# Patient Record
Sex: Female | Born: 1960
Health system: Southern US, Community
[De-identification: ages and names within clinical notes are randomized; demographics above are authoritative.]

## PROBLEM LIST (undated history)

## (undated) DIAGNOSIS — M542 Cervicalgia: Secondary | ICD-10-CM

## (undated) HISTORY — DX: Cervicalgia: M54.2

---

## 2018-07-14 ENCOUNTER — Ambulatory Visit: Payer: BLUE CROSS/BLUE SHIELD | Admitting: Family Medicine

## 2018-07-14 ENCOUNTER — Other Ambulatory Visit: Payer: Self-pay

## 2018-07-14 ENCOUNTER — Encounter: Payer: Self-pay | Admitting: Family Medicine

## 2018-07-14 VITALS — BP 112/80 | HR 92 | Temp 97.9°F | Resp 14 | Ht 64.17 in | Wt 132.9 lb

## 2018-07-14 DIAGNOSIS — G4709 Other insomnia: Secondary | ICD-10-CM

## 2018-07-14 DIAGNOSIS — Z1159 Encounter for screening for other viral diseases: Secondary | ICD-10-CM | POA: Diagnosis not present

## 2018-07-14 DIAGNOSIS — R945 Abnormal results of liver function studies: Secondary | ICD-10-CM

## 2018-07-14 DIAGNOSIS — E785 Hyperlipidemia, unspecified: Secondary | ICD-10-CM

## 2018-07-14 DIAGNOSIS — K219 Gastro-esophageal reflux disease without esophagitis: Secondary | ICD-10-CM | POA: Diagnosis not present

## 2018-07-14 DIAGNOSIS — K449 Diaphragmatic hernia without obstruction or gangrene: Secondary | ICD-10-CM

## 2018-07-14 DIAGNOSIS — R7989 Other specified abnormal findings of blood chemistry: Secondary | ICD-10-CM

## 2018-07-14 DIAGNOSIS — Z860100 Personal history of colon polyps, unspecified: Secondary | ICD-10-CM

## 2018-07-14 DIAGNOSIS — M542 Cervicalgia: Secondary | ICD-10-CM

## 2018-07-14 DIAGNOSIS — Z1231 Encounter for screening mammogram for malignant neoplasm of breast: Secondary | ICD-10-CM

## 2018-07-14 DIAGNOSIS — Z114 Encounter for screening for human immunodeficiency virus [HIV]: Secondary | ICD-10-CM

## 2018-07-14 MED ORDER — HYDROXYZINE PAMOATE 25 MG PO CAPS: 25.0000 mg | ORAL_CAPSULE | Freq: Every evening | ORAL | 0 refills | Status: DC | PRN

## 2018-07-14 MED ORDER — OMEPRAZOLE 20 MG PO CPDR: 20.0000 mg | DELAYED_RELEASE_CAPSULE | Freq: Every day | ORAL | 0 refills | Status: DC

## 2018-07-14 NOTE — Patient Instructions (Signed)
Use white noise machine or fan to help block out sounds.  Sleep Hygiene Tips 1) Get regular. One of the best ways to train your body to sleep well is to go to bed and get up at more or less the same time every day, even on weekends and days off! This regular rhythm will make you feel better and will give your body something to work from. 2) Sleep when sleepy. Only try to sleep when you actually feel tired or sleepy, rather than spending too much time awake in bed. 3) Get up & try again. If you haven't been able to get to sleep after about 20 minutes or more, get up and do something calming or boring until you feel sleepy, then return to bed and try again. Sit quietly on the couch with the lights off (bright light will tell your brain that it is time to wake up), or read something boring like the phone book. Avoid doing anything that is too stimulating or interesting, as this will wake you up even more. 4) Avoid caffeine & nicotine. It is best to avoid consuming any caffeine (in coffee, tea, cola drinks, chocolate, and some medications) or nicotine (cigarettes) for at least 4-6 hours before going to bed. These substances act as stimulants and interfere with the ability to fall asleep 5) Avoid alcohol. It is also best to avoid alcohol for at least 4-6 hours before going to bed. Many people believe that alcohol is relaxing and helps them to get to sleep at first, but it actually interrupts the quality of sleep. 6) Bed is for sleeping. Try not to use your bed for anything other than sleeping and sex, so that your body comes to associate bed with sleep. If you use bed as a place to watch TV, eat, read, work on your laptop, pay bills, and other things, your body will not learn this Connection. 7) No naps. It is best to avoid taking naps during the day, to make sure that you are tired at bedtime. If you can't make it through the day without a nap, make sure it is for less than an  hour and before 3pm. 8) Sleep rituals. You can develop your own rituals of things to remind your body that it is time to sleep - some people find it useful to do relaxing stretches or breathing exercises for 15 minutes before bed each night, or sit calmly with a cup of caffeine-free tea. 9) Bathtime. Having a hot bath 1-2 hours before bedtime can be useful, as it will raise your body temperature, causing you to feel sleepy as your body temperature drops again. Research shows that sleepiness is associated with a drop in body temperature. 10) No clock-watching. Many people who struggle with sleep tend to watch the clock too much. Frequently checking the clock during the night can wake you up (especially if you turn on the light to read the time) and reinforces negative thoughts such as "Oh no, look how late it is, I'll never get to sleep" or "it's so early, I have only slept for 5 hours, this is terrible." 11) Use a sleep diary. This worksheet can be a useful way of making sure you have the right facts about your sleep, rather than making assumptions. Because a diary involves watching the clock (see point 10) it is a good idea to only use it for two weeks to get an idea of what is going and then perhaps two months down the  track to see how you are progressing. 12) Exercise. Regular exercise is a good idea to help with good sleep, but try not to do strenuous exercise in the 4 hours before bedtime. Morning walks are a great way to start the day feeling refreshed! 13) Eat right. A healthy, balanced diet will help you to sleep well, but timing is important. Some people find that a very empty stomach at bedtime is distracting, so it can be useful to have a light snack, but a heavy meal soon before bed can also interrupt sleep. Some people recommend a warm glass of milk, which contains tryptophan, which acts as a natural sleep inducer. 14) The right space. It is very important that your  bed and bedroom are quiet and comfortable for sleeping. A cooler room with enough blankets to stay warm is best, and make sure you have curtains or an eyemask to block out early morning light and earplugs if there is noise outside your room. 15) Keep daytime routine the same. Even if you have a bad night sleep and are tired it is important that you try to keep your daytime activities the same as you had planned. That is, don't avoid activities because you feel tired. This can reinforce the insomnia.
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
## 2018-07-17 LAB — LIPID PANEL
Cholesterol: 250 mg/dL — ABNORMAL HIGH (ref <200)
HDL: 80 mg/dL (ref >=50)
LDL Cholesterol (Calc): 151 mg/dL (calc) — ABNORMAL HIGH
Non-HDL Cholesterol (Calc): 170 mg/dL (calc) — ABNORMAL HIGH (ref <130)
Total CHOL/HDL Ratio: 3.1 (calc) (ref <5.0)
Triglycerides: 88 mg/dL (ref <150)

## 2018-07-17 LAB — CBC WITH DIFFERENTIAL/PLATELET
Absolute Monocytes: 431 cells/uL (ref 200–950)
Basophils Absolute: 10 cells/uL (ref 0–200)
Basophils Relative: 0.2 %
Eosinophils Absolute: 108 cells/uL (ref 15–500)
Eosinophils Relative: 2.2 %
HEMATOCRIT: 38.5 % (ref 35.0–45.0)
Hemoglobin: 13.5 g/dL (ref 11.7–15.5)
Lymphs Abs: 2381 cells/uL (ref 850–3900)
MCH: 32.1 pg (ref 27.0–33.0)
MCHC: 35.1 g/dL (ref 32.0–36.0)
MCV: 91.4 fL (ref 80.0–100.0)
MPV: 11.2 fL (ref 7.5–12.5)
Monocytes Relative: 8.8 %
Neutro Abs: 1970 cells/uL (ref 1500–7800)
Neutrophils Relative %: 40.2 %
Platelets: 305 10*3/uL (ref 140–400)
RBC: 4.21 10*6/uL (ref 3.80–5.10)
RDW: 12.1 % (ref 11.0–15.0)
Total Lymphocyte: 48.6 %
WBC: 4.9 10*3/uL (ref 3.8–10.8)

## 2018-07-17 LAB — HEPATITIS C ANTIBODY
Hepatitis C Ab: NONREACTIVE
SIGNAL TO CUT-OFF: 0.01 (ref <1.00)

## 2018-07-17 LAB — COMPLETE METABOLIC PANEL WITH GFR
AG RATIO: 1.5 (calc) (ref 1.0–2.5)
ALT: 29 U/L (ref 6–29)
AST: 56 U/L — ABNORMAL HIGH (ref 10–35)
Albumin: 4.4 g/dL (ref 3.6–5.1)
Alkaline phosphatase (APISO): 72 U/L (ref 37–153)
BUN: 15 mg/dL (ref 7–25)
CO2: 26 mmol/L (ref 20–32)
Calcium: 9.7 mg/dL (ref 8.6–10.4)
Chloride: 104 mmol/L (ref 98–110)
Creat: 0.84 mg/dL (ref 0.50–1.05)
GFR, Est African American: 89 mL/min/{1.73_m2} (ref >=60)
GFR, Est Non African American: 77 mL/min/{1.73_m2} (ref >=60)
Globulin: 3 g/dL (calc) (ref 1.9–3.7)
Glucose, Bld: 83 mg/dL (ref 65–99)
Potassium: 4.3 mmol/L (ref 3.5–5.3)
SODIUM: 140 mmol/L (ref 135–146)
Total Bilirubin: 0.4 mg/dL (ref 0.2–1.2)
Total Protein: 7.4 g/dL (ref 6.1–8.1)

## 2018-07-17 LAB — HIV ANTIBODY (ROUTINE TESTING W REFLEX): HIV 1&2 Ab, 4th Generation: NONREACTIVE

## 2018-08-07 ENCOUNTER — Other Ambulatory Visit: Payer: Self-pay | Admitting: Family Medicine

## 2018-08-07 DIAGNOSIS — G4709 Other insomnia: Secondary | ICD-10-CM

## 2018-10-07 ENCOUNTER — Other Ambulatory Visit: Payer: Self-pay | Admitting: Family Medicine

## 2018-10-07 DIAGNOSIS — K219 Gastro-esophageal reflux disease without esophagitis: Secondary | ICD-10-CM

## 2018-10-07 DIAGNOSIS — K449 Diaphragmatic hernia without obstruction or gangrene: Secondary | ICD-10-CM

## 2018-10-09 ENCOUNTER — Ambulatory Visit: Payer: Self-pay | Admitting: Gastroenterology

## 2018-11-09 ENCOUNTER — Ambulatory Visit: Payer: BLUE CROSS/BLUE SHIELD | Admitting: Gastroenterology

## 2018-11-13 ENCOUNTER — Encounter: Payer: Self-pay | Admitting: Family Medicine

## 2018-11-21 ENCOUNTER — Other Ambulatory Visit: Payer: Self-pay | Admitting: Family Medicine

## 2018-11-21 DIAGNOSIS — Z1231 Encounter for screening mammogram for malignant neoplasm of breast: Secondary | ICD-10-CM

## 2018-12-12 ENCOUNTER — Other Ambulatory Visit: Payer: Self-pay

## 2018-12-12 ENCOUNTER — Ambulatory Visit: Payer: 59 | Admitting: Gastroenterology

## 2018-12-12 VITALS — BP 113/74 | HR 89 | Temp 98.0°F | Ht 64.0 in | Wt 135.8 lb

## 2018-12-12 DIAGNOSIS — K3189 Other diseases of stomach and duodenum: Secondary | ICD-10-CM | POA: Diagnosis not present

## 2018-12-12 DIAGNOSIS — K219 Gastro-esophageal reflux disease without esophagitis: Secondary | ICD-10-CM

## 2018-12-12 DIAGNOSIS — K31A0 Gastric intestinal metaplasia, unspecified: Secondary | ICD-10-CM

## 2018-12-12 DIAGNOSIS — R7989 Other specified abnormal findings of blood chemistry: Secondary | ICD-10-CM

## 2018-12-12 DIAGNOSIS — Z86010 Personal history of colonic polyps: Secondary | ICD-10-CM | POA: Diagnosis not present

## 2018-12-12 DIAGNOSIS — R945 Abnormal results of liver function studies: Secondary | ICD-10-CM

## 2018-12-12 MED ORDER — FAMOTIDINE 40 MG PO TABS: 40.0000 mg | ORAL_TABLET | Freq: Every day | ORAL | 0 refills | Status: DC

## 2018-12-12 NOTE — Progress Notes (Signed)
Jaime MoodKiran Ahnyla Mendel MD, MRCP(U.K) [address]  [address]  [place], [place] [address]  Main: [phone]  Fax: [phone]   Gastroenterology Consultation  Referring Provider:     Doren CustardBoyce, Jaime E, [name] Primary Care Physician:  Jaime CustardBoyce, Jaime E, [name] Primary Gastroenterologist:  Dr. Wyline MoodKiran Jaime Rice  Reason for Consultation:     Acid reflux abnormal LFTs.        HPI:   Jaime Rice is a 58 y.o. y/o female referred for consultation & management  by Dr. Annye AsaBoyce, Jaime ApleyEmily E, [name].   She has been referred for acid reflux, abnormal LFTs, colon cancer screening.  History of colon polyps in the past.  Labs in 07/14/2018 minimally elevated AST of 56.  Hepatitis C antibody negative.  HIV negative. Elevated LDL and total cholesterol.  CBC normal.  She recently moved from [place] a few months back.  She brought some information which was provided to her back at her prior GI doctor in [place].  She has a diagnosis of gastric intestinal metaplasia and recalls having had H. pylori treated in the past.  She denies any abdominal pain at this point of time.  She wanted to know when she would need to have her next upper endoscopy.  It was over 2 years back that she had a last.  She has a history of colon polyps and was told she needed a colonoscopy every year but her last colonoscopy was in 2018 she states which was normal.  She has a history of acid reflux and has been on omeprazole, rabeprazole, omeprazole.  She is concerned of the side effects and wanted know what should she would take on a long-term basis with minimal side effects.  She is also found to have minimally elevated transaminases in the past.  Denies any active alcohol use.  Past Medical History:  Diagnosis Date  . Cervical spine pain     No past surgical history on file.  Prior to Admission medications   Medication Sig Start Date End Date Taking? Authorizing Provider  hydrOXYzine (VISTARIL) 25 MG capsule TAKE 1 CAPSULE (25 MG TOTAL) BY MOUTH AT  BEDTIME AS NEEDED (INSOMNIA). 08/07/18   Jaime CustardBoyce, Jaime E, [name]  omeprazole (PRILOSEC) 20 MG capsule TAKE 1 CAPSULE BY MOUTH EVERY DAY 10/07/18   Jaime CustardBoyce, Jaime E, [name]    No family history on file.   Social History   Tobacco Use  . Smoking status: Never Smoker  . Smokeless tobacco: Never Used  Substance Use Topics  . Alcohol use: Yes    Frequency: Never    Comment: occasional  . Drug use: Never    Allergies as of 12/12/2018  . (No Known Allergies)    Review of Systems:    All systems reviewed and negative except where noted in HPI.   Physical Exam:  BP 113/74   Pulse 89   Temp 98 F (36.7 C)   Ht 5\' 4"  (1.626 m)   Wt 135 lb 12.8 oz (61.6 kg)   BMI 23.31 kg/m  No LMP recorded. Psych:  Alert and cooperative. Normal Rice and affect. General:   Alert,  Well-developed, well-nourished, pleasant and cooperative in NAD Head:  Normocephalic and atraumatic. Eyes:  Sclera clear, no icterus.   Conjunctiva pink. Ears:  Normal auditory acuity. Nose:  No deformity, discharge, or lesions. Mouth:  No deformity or lesions,oropharynx pink & moist. Neck:  Supple; no masses or thyromegaly. Lungs:  Respirations even and unlabored.  Clear throughout to auscultation.  No wheezes, crackles, or rhonchi. No acute distress. Heart:  Regular rate and rhythm; no murmurs, clicks, rubs, or gallops. Abdomen:  Normal bowel sounds.  No bruits.  Soft, non-tender and non-distended without masses, hepatosplenomegaly or hernias noted.  No guarding or rebound tenderness.    Msk:  Symmetrical without gross deformities. Good, equal movement & strength bilaterally. Pulses:  Normal pulses noted. Extremities:  No clubbing or edema.  No cyanosis. Neurologic:  Alert and oriented x3;  grossly normal neurologically. Skin:  Intact without significant lesions or rashes. No jaundice. Lymph Nodes:  No significant cervical adenopathy. Psych:  Alert and cooperative. Normal Rice and affect.  Imaging Studies: No results  found.  Assessment and Plan:   Jaime Rice is a 58 y.o. y/o female has been referred for GERD, abnormal LFT, h/o colon polyps and gastric intestinal metaplasia.  Plan  1. Recheck LFT's if elevated will evaluate further 2. Check for H pylori breath test to confirm negativity 3. GERD- patient information  4. Commence on pepcid 40 mg a day: Discussed long-term use is probably safe with Pepcid.  If she is doing well on 40 mg I will reduce it to 20 mg at her next visit. 5. EGD to evaluate for gastric intestinal metaplasia with gastric mapping and colon polyp surveillance in jan 2021 .   I have discussed alternative options, risks & benefits,  which include, but are not limited to, bleeding, infection, perforation,respiratory complication & drug reaction.  The patient agrees with this plan & written consent will be obtained.     Follow up in 8 weeks   Dr Jonathon Bellows MD,MRCP(U.K)

## 2018-12-13 LAB — HEPATIC FUNCTION PANEL
ALT: 38 IU/L — ABNORMAL HIGH (ref 0–32)
AST: 66 IU/L — ABNORMAL HIGH (ref 0–40)
Albumin: 4.5 g/dL (ref 3.8–4.9)
Alkaline Phosphatase: 80 IU/L (ref 39–117)
Bilirubin Total: 0.2 mg/dL (ref 0.0–1.2)
Bilirubin, Direct: 0.06 mg/dL (ref 0.00–0.40)
Total Protein: 7 g/dL (ref 6.0–8.5)

## 2018-12-13 LAB — H. PYLORI BREATH TEST: H pylori Breath Test: NEGATIVE

## 2018-12-17 ENCOUNTER — Encounter: Payer: Self-pay | Admitting: Gastroenterology

## 2018-12-20 ENCOUNTER — Other Ambulatory Visit: Payer: Self-pay

## 2018-12-20 ENCOUNTER — Telehealth: Payer: Self-pay

## 2018-12-20 DIAGNOSIS — R7989 Other specified abnormal findings of blood chemistry: Secondary | ICD-10-CM

## 2018-12-20 NOTE — Telephone Encounter (Signed)
-----   Message from Jonathon Bellows, MD sent at 12/17/2018 11:35 AM EDT ----- Sherald Hess inform   1. H pylori test is negative 2. LFT's still mildly elevated- suggest blood work to evaluate abnormal LFT's if willing please order my list of tests, RUQ USG  C/c Hubbard Hartshorn, [name]   Dr Jonathon Bellows MD,MRCP [facility]) Gastroenterology/Hepatology Pager: [phone]

## 2018-12-20 NOTE — Telephone Encounter (Signed)
Spoke with pt and informed her of lab results and Dr. Vicente Males suggestions. Pt agrees to have the additional lab tests and we have scheduled the ultrasound appointment.

## 2018-12-21 ENCOUNTER — Ambulatory Visit
Admission: RE | Admit: 2018-12-21 | Discharge: 2018-12-21 | Disposition: A | Discharge status: Home / Self Care | Payer: 59 | Source: Ambulatory Visit | Attending: Family Medicine | Admitting: Family Medicine

## 2018-12-21 DIAGNOSIS — Z1231 Encounter for screening mammogram for malignant neoplasm of breast: Secondary | ICD-10-CM | POA: Insufficient documentation

## 2018-12-25 LAB — ANTI-MICROSOMAL ANTIBODY LIVER / KIDNEY: LKM1 Ab: 0.8 Units (ref 0.0–20.0)

## 2018-12-25 LAB — HEPATITIS B CORE ANTIBODY, TOTAL: Hep B Core Total Ab: NEGATIVE

## 2018-12-25 LAB — HEPATITIS B E ANTIBODY: Hep B E Ab: NEGATIVE

## 2018-12-25 LAB — CERULOPLASMIN: Ceruloplasmin: 24.6 mg/dL (ref 19.0–39.0)

## 2018-12-25 LAB — MITOCHONDRIAL/SMOOTH MUSCLE AB PNL
Mitochondrial Ab: <20 Units (ref 0.0–20.0)
Smooth Muscle Ab: 7 Units (ref 0–19)

## 2018-12-25 LAB — IRON,TIBC AND FERRITIN PANEL
Ferritin: 144 ng/mL (ref 15–150)
Iron Saturation: 32 % (ref 15–55)
Iron: 94 ug/dL (ref 27–159)
Total Iron Binding Capacity: 297 ug/dL (ref 250–450)
UIBC: 203 ug/dL (ref 131–425)

## 2018-12-25 LAB — HIV ANTIBODY (ROUTINE TESTING W REFLEX): HIV Screen 4th Generation wRfx: NONREACTIVE

## 2018-12-25 LAB — CK: Total CK: 98 U/L (ref 32–182)

## 2018-12-25 LAB — IMMUNOGLOBULINS A/E/G/M, SERUM
IgE (Immunoglobulin E), Serum: 147 IU/mL (ref 6–495)
IgG (Immunoglobin G), Serum: 1000 mg/dL (ref 586–1602)
IgM (Immunoglobulin M), Srm: 114 mg/dL (ref 26–217)

## 2018-12-25 LAB — CELIAC DISEASE PANEL
Endomysial IgA: NEGATIVE
IgA/Immunoglobulin A, Serum: 219 mg/dL (ref 87–352)
Transglutaminase IgA: <2 U/mL (ref 0–3)

## 2018-12-25 LAB — HEPATITIS B SURFACE ANTIBODY,QUALITATIVE: Hep B Surface Ab, Qual: REACTIVE

## 2018-12-25 LAB — HEPATITIS B E ANTIGEN: Hep B E Ag: NEGATIVE

## 2018-12-25 LAB — HEPATITIS A ANTIBODY, TOTAL: hep A Total Ab: POSITIVE — AB

## 2018-12-25 LAB — ANA: Anti Nuclear Antibody (ANA): NEGATIVE

## 2018-12-25 LAB — ALPHA-1-ANTITRYPSIN: A-1 Antitrypsin: 115 mg/dL (ref 101–187)

## 2018-12-25 LAB — HEPATITIS B SURFACE ANTIGEN: Hepatitis B Surface Ag: NEGATIVE

## 2018-12-25 LAB — HEPATITIS C ANTIBODY: Hep C Virus Ab: 0.1 s/co ratio (ref 0.0–0.9)

## 2018-12-26 ENCOUNTER — Telehealth: Payer: Self-pay | Admitting: Gastroenterology

## 2018-12-26 NOTE — Telephone Encounter (Signed)
PT is calling for Dr. Georgeann Oppenheim nurse

## 2018-12-26 NOTE — Telephone Encounter (Signed)
-----   Message from Jonathon Bellows, MD sent at 12/25/2018  9:54 AM EDT ----- Sim Boast inform all lab work looks normal.  She is immune to hepatitis A and B probably due to prior vaccination which is expected.  Those results are not abnormal.  C/c Hubbard Hartshorn, [name]   Dr Jonathon Bellows MD,MRCP [facility]) Gastroenterology/Hepatology Pager: [phone]

## 2018-12-26 NOTE — Telephone Encounter (Signed)
Spoke with pt and informed her of lab results. Pt informed me that pre-service contacted her regarding pt ultrasound appt and informed her that her insurance is not in-network with our radiology facility but are in-network with [facility] in [place]. Pt is requesting that we send the U/S order. I explained that I will contact their facility.

## 2018-12-28 ENCOUNTER — Ambulatory Visit: Payer: 59

## 2018-12-28 ENCOUNTER — Encounter: Payer: Self-pay | Admitting: Gastroenterology

## 2019-01-11 ENCOUNTER — Other Ambulatory Visit: Payer: Self-pay | Admitting: Gastroenterology

## 2019-01-11 DIAGNOSIS — R945 Abnormal results of liver function studies: Secondary | ICD-10-CM

## 2019-01-11 DIAGNOSIS — R7989 Other specified abnormal findings of blood chemistry: Secondary | ICD-10-CM

## 2019-01-16 ENCOUNTER — Ambulatory Visit
Admission: RE | Admit: 2019-01-16 | Discharge: 2019-01-16 | Disposition: A | Discharge status: Home / Self Care | Payer: 59 | Source: Ambulatory Visit | Attending: Gastroenterology | Admitting: Gastroenterology

## 2019-01-16 DIAGNOSIS — R7989 Other specified abnormal findings of blood chemistry: Secondary | ICD-10-CM

## 2019-01-16 DIAGNOSIS — R945 Abnormal results of liver function studies: Secondary | ICD-10-CM

## 2019-01-17 ENCOUNTER — Encounter: Payer: Self-pay | Admitting: Gastroenterology

## 2019-01-30 ENCOUNTER — Ambulatory Visit (INDEPENDENT_AMBULATORY_CARE_PROVIDER_SITE_OTHER): Payer: 59 | Admitting: Family Medicine

## 2019-01-30 ENCOUNTER — Encounter: Payer: Self-pay | Admitting: Family Medicine

## 2019-01-30 ENCOUNTER — Other Ambulatory Visit: Payer: Self-pay

## 2019-01-30 ENCOUNTER — Other Ambulatory Visit (HOSPITAL_COMMUNITY)
Admission: RE | Admit: 2019-01-30 | Discharge: 2019-01-30 | Disposition: A | Discharge status: Home / Self Care | Payer: 59 | Source: Ambulatory Visit | Attending: Family Medicine | Admitting: Family Medicine

## 2019-01-30 VITALS — BP 112/72 | HR 83 | Temp 97.3°F | Resp 14 | Ht 64.0 in | Wt 137.0 lb

## 2019-01-30 DIAGNOSIS — Z01419 Encounter for gynecological examination (general) (routine) without abnormal findings: Secondary | ICD-10-CM | POA: Diagnosis not present

## 2019-01-30 DIAGNOSIS — M5020 Other cervical disc displacement, unspecified cervical region: Secondary | ICD-10-CM | POA: Insufficient documentation

## 2019-01-30 DIAGNOSIS — Z124 Encounter for screening for malignant neoplasm of cervix: Secondary | ICD-10-CM

## 2019-01-30 DIAGNOSIS — L2082 Flexural eczema: Secondary | ICD-10-CM | POA: Diagnosis not present

## 2019-01-30 DIAGNOSIS — Z23 Encounter for immunization: Secondary | ICD-10-CM | POA: Diagnosis not present

## 2019-01-30 MED ORDER — TRIAMCINOLONE ACETONIDE 0.1 % EX CREA: 1.0000 "application " | TOPICAL_CREAM | Freq: Two times a day (BID) | CUTANEOUS | 1 refills | Status: DC

## 2019-01-30 NOTE — Patient Instructions (Addendum)
Foam Roller to help with hip pain. Try exercises for about a month, if you're not improving, let us know, and we will refer you to to physical therapy.   ???????????? ????? ??????????? ?????????????? ??????? Hip Rehabilitation in the Home ????? ???????? ?? ????? ????? ?????, ????? ?? ????????? ??????????? ?????? ???????? ????? ?? ?????????? ???????????? ??????????????. ????? ??????????? ????? ?????????, ??????? ????? ?????????? ? ??????????? ??? ???. ??? ??????? ???? ? ????-???????????? ????? ???????? ?????? ? ????, ????? ?????????????? ? ????? ????????????? ???????????? ? ????????????. ?????? ????????????? ???????????? ?????????????? ??????? ???????:  ???????? ??? ????????????? ??????.  ???????? ??????????? ? ???????? (???????? ????????) ?????? ?????????????? ???????.  ????????? ????.  ???????? ?????????????? ? ????????????? ??????????? ???????. ??? ????????????? ????   ??????????? ?????? ???? ??????????, ??????? ??????? ??? ??????? ???? ??? ????-???????????? ? ????????.  ????? ??????????? ?????????? ??????????: ? ?????????? ?????????????? ?????????, ???? ??? ?????????????? ??? ?????? ??? ??????? ????. ?? ?????????? ????????????? ????????, ?? ???????? ? ??? ???????? ?????? ??? ?? ?????????? ??????????. ? ??????? ????????, ????????, ?????? ?????? ???, ??? ??????? ????? ??????? ??????. ??? ???????? ????????? ????? ? ????????????? ??????.  ?? ????? ?????????? ??????????: ? ? ????????? ???? ?????????, ??? ?? ?????????? ????? ? ?????????? ?????????, ????????, ??????? ??? ??????, ?? ??????? ?? ?????? ????????? ??? ?????????? ??????????. ? ?????????? ?????????? ????? ???, ??? ??????? ????? ??????? ?????? ? ???????????????? ?? ??? ???? ? ???????????? ? ????????????. ? ?? ???? ?????? ?????????????, ?????????? ?????????? ??? ??? ???? ?????????? ? ?????????? ? ?????????? ???? ??????????. ? ?????????? ??????????? ????????, ??? ??????? ????? ??????? ??????. ??? ????? ???? ??????????, ? ???????????  ?? ???? ???????? ?? ????????????? ???????, ??????? ?? ?????????. ????????, ??? ??????? ???? ????? ???????????? ???:  ?? ????????? ?????? ???? ?????? ?????.  ?? ?????????? ????.  ?? ?????????????? ? ????????? ???? ??? ????.  ???????? ???????????? ?????? ?????? ???????????? ???? ????????. ?????????? ????? ???, ????? ?????? ???? ?????????? ??????.  ?? ?????? ?????????? ? ???????? (???????????), ???? ?? ??????? ????????????????? ??????, ? ?? ?? ???????? ?? ??? ?????????? ?????? ???????? ?????. ? ???????? ???????? ???????? ????? ????????????: ???? ????????????  He ??????????? ???? ?????????????? ???? ??? ???????? ?? ??? ???? ???? ?? ????????? ?????????? ?????? ???????? ?????. ??????????? ??????? ??? ??????? ??? ??????? ????? ??????? ??????.  ???????? ? ?????? ???????? ?????, ????? ???? ???????? ??????????? ??? ??? ? ?????? ?????????????, ? ????? ????????, ????? ??? ?????????? ????????.  ????????? ???????????????? ??????? ??? ????????. ?????? 1-2 ???? ????????????, ????? ????????. ???? ?? ?????????? ????????, ??? ??? ?????? ???????????? ??????????, ????????? ? ??????. ?????????? ????, ??????????????? ? ?????   ????????????? ??? ? ?????????? ???????? ????? ?????????? ????????, ??? ?? ???? ?????????????. ???????? ???????? ???????? ????????? ???? ? ???? ???????. ? ????????? ??? ? ??????????? ?????, ??????? ??? ??????. ? ?????????? ????????? ????? ????? ????? ? ???????. ? ????????????? ????? ?? ????? ?? 20????? 2-3???? ? ????.  ???? ??? ?????????, ????????????? ????? ? ?????????? ???????? ????? ?????????? ?????????, ??? ?? ???? ?????????????. ????? ???????? ??????? ??????????? ???? ? ????????. ??????????? ???????? ?????, ??????????????? ????? ??????? ??????, ???????? ??????? ???????? ??? ??????. ? ?????????? ????????? ????? ????? ????? ? ?????? ??????????. ? ???????? ?????? ???????? ??? ?????? ?? 20-30?????. ? ???? ???? ?????????? ????-???????, ??????? ?????? ???????? ??? ??????. ??? ????????  ?????, ???? ?? ?? ???????? ??????????? ????, ????? ??? ?????. ? ??? ????? ???? ??????? ???? ????????? ??????.  ?????? ?????????????? ????? ??? ??????? ????? ??????? ??????. ??? ????? ????????????? ??????????? ??????? ? ???????????? ?????????? ????? ???.  ?????????? (?????????????) ????, ???????? ? ????????? ???? ??? ????. ???????????? ???????  ? ????? ???? ?????? ???? ??????? ????????? ? ??????? ?????????, ???????? ?? ???????? ? ?? ?????????. ???? ?????? ???? ??????, ? ?????????????????? ?????????.  ??????? ? ????? ????????, ? ??????? ????? ???????????, ????????, ???????????? ????????? ??????? ? ???????.  ? ??????? ?????????? ???????, ? ? ???????? ? ? ??????? ???????.  ?????? ???????? ????? ?? ???????, ??????? ???????????? ????????? ????. ?????? ????? ? ????????? ???????? ??? ??????? ?????????.  ???????? ?????? ???????? ????? ? ????????????? ?????????, ??????????? ??? ??????? ? ??????????? ????????????? ??????????, ??????? ?? ? ????????? ????? ??????????. ????????? ????????? ????? ????????? ???? ?????? ???????. ????? ????????????  ?????????? ????? ????????????? ? ????? ????????? ? ??? ??? ????? ?????? ? ????? ?????????????. ?????????? ???????? ?????? ????? ????? ?? ???? ????????????.  ?????????? ?????????????? ? ??????????? ????????????? ????????? ?????? ??? ??????? ????? ??????? ??????.  ????????? ?? ??? ??????????? ?????? ??? ??????? ????? ??????? ?????? ??? ??????????????. ??? ?????. ???????, ??????? ??????? ?????? ?????? ???????? ?????  ????? ?????????? ?????????? ????????? ??? ?????  ????????? ????? ??? ????? ?????? ???????????  ??? ? ???? ?????????????? ???? ?? ????? ?????????? ?????????  ????? ??? ???? ???????????? ????????? ??? ????? ?????????? ? ???????? ?????, ????:  ? ??? ????????? ??????? ? ?????????? ??????? ?????????? ??????????.  ? ??? ??????? ???? ? ????????????? ??????? ??? ??????? ???????, ?? ?????????? ???? ????? ?????? ? ?????? ??????????????.  ?? ??????????,  ??? ??? ????????????? ????????????? ?????? (??????) ??????????.  ?? ?? ?????? ?????? ??????????. ?????????? ?????????? ?? ???????, ????:  ?? ?????.  ???? ?????????????????? ??????? ?????????.  ???? ??????? ???? ?????? ?????? ? ?????? ?????? ?? ????. ??????  ???????????? ?????????????? ??????? ????????? ???????? ??? ? ???????? ???????? ? ??????????? ? ???.  ??????????? ?????? ?????????? ????, ???????? ????????? ?????? ???????? ????? ??? ??????????????, ??????? ?? ???????? ? ????????.  ???? ??? ????????? ??????? ? ?????????? ??????? ?????????? ??????????, ?????????? ? ?????? ???????? ?????. ??? ?????????? ?? ????? ???????? ??????, ??????????????? ????? ??????. ??????????? ???????? ????? ???????????? ??? ??????? ? ????? ??????? ??????.  Document Released: 07/09/2016 Document Revised: 07/23/2017 Document Reviewed: 04/14/2017 [employer] Patient Education  2020 [employer].  Hip Exercises Ask your health care provider which exercises are safe for you. Do exercises exactly as told by your health care provider and adjust them as directed. It is normal to feel mild stretching, pulling, tightness, or discomfort as you do these exercises. Stop right away if you feel sudden pain or your pain gets worse. Do not begin these exercises until told by your health care provider. Stretching and range-of-motion exercises These exercises warm up your muscles and joints and improve the movement and flexibility of your hip. These exercises also help to relieve pain, numbness, and tingling. You may be asked to limit your range of motion if you had a hip replacement. Talk to your health care provider about these restrictions. Hamstrings, supine  1. Lie on your back (supine position). 2. Loop a belt or towel over the ball of your left / right foot. The ball of your foot is on the walking surface, right under your toes. 3. Straighten your left / right knee and slowly pull on the belt or towel to raise your leg until  you feel a gentle stretch behind your knee (hamstring). ? Do not let your knee bend while you do this. ? Keep your other leg flat on the floor. 4. Hold this position for __________ seconds. 5. Slowly return your leg to the starting position. Repeat __________ times. Complete this exercise __________ times a day. Hip rotation  1. Lie on your back on a firm surface. 2. With your left / right hand, gently pull your left / right knee toward the shoulder that is on the same side of the body. Stop when your knee is pointing toward the ceiling. 3. Hold your left / right ankle with your other hand. 4. Keeping your knee steady, gently pull your left / right ankle toward your other shoulder until you feel a stretch in your buttocks. ? Keep your hips and shoulders firmly planted while you do this stretch. 5. Hold this position for __________ seconds. Repeat __________ times. Complete this exercise __________ times a day. Seated stretch This exercise is sometimes called hamstrings and adductors stretch. 1. Sit on the floor with your legs stretched wide. Keep your knees straight during this exercise. 2. Keeping your head and back in a straight line, bend at your waist to reach for your left foot (position A). You should feel a stretch in your right inner thigh (adductors). 3. Hold this position for __________ seconds. Then slowly return to the upright position. 4. Keeping your head and back in a straight line, bend at your waist to reach forward (position B). You should feel a stretch behind both of your thighs and knees (hamstrings). 5. Hold this position for __________ seconds. Then slowly return to the upright position. 6. Keeping your head and back in a straight line, bend at your waist to reach for your right foot (position C). You should feel a stretch in your left inner thigh (adductors). 7. Hold this position for __________ seconds. Then slowly return to the upright position. Repeat __________  times. Complete this exercise __________ times a day. Lunge This exercise stretches the muscles of the hip (hip flexors). 1. Place your left / right knee on the floor and bend your other knee so that is directly over your ankle. You should be half-kneeling. 2. Keep good posture with your head over your shoulders. 3. Tighten your buttocks to  point your tailbone downward. This will prevent your back from arching too much. 4. You should feel a gentle stretch in the front of your left / right thigh and hip. If you do not feel a stretch, slide your other foot forward slightly and then slowly lunge forward with your chest up until your knee once again lines up over your ankle. ? Make sure your tailbone continues to point downward. 5. Hold this position for __________ seconds. 6. Slowly return to the starting position. Repeat __________ times. Complete this exercise __________ times a day. Strengthening exercises These exercises build strength and endurance in your hip. Endurance is the ability to use your muscles for a long time, even after they get tired. Bridge This exercise strengthens the muscles of your hip (hip extensors). 1. Lie on your back on a firm surface with your knees bent and your feet flat on the floor. 2. Tighten your buttocks muscles and lift your bottom off the floor until the trunk of your body and your hips are level with your thighs. ? Do not arch your back. ? You should feel the muscles working in your buttocks and the back of your thighs. If you do not feel these muscles, slide your feet 1-2 inches (2.5-5 cm) farther away from your buttocks. 3. Hold this position for __________ seconds. 4. Slowly lower your hips to the starting position. 5. Let your muscles relax completely between repetitions. Repeat __________ times. Complete this exercise __________ times a day. Straight leg raises, side-lying This exercise strengthens the muscles that move the hip joint away from the  center of the body (hip abductors). 1. Lie on your side with your left / right leg in the top position. Lie so your head, shoulder, hip, and knee line up. You may bend your bottom knee slightly to help you balance. 2. Roll your hips slightly forward, so your hips are stacked directly over each other and your left / right knee is facing forward. 3. Leading with your heel, lift your top leg 4-6 inches (10-15 cm). You should feel the muscles in your top hip lifting. ? Do not let your foot drift forward. ? Do not let your knee roll toward the ceiling. 4. Hold this position for __________ seconds. 5. Slowly return to the starting position. 6. Let your muscles relax completely between repetitions. Repeat __________ times. Complete this exercise __________ times a day. Straight leg raises, side-lying This exercise strengthen the muscles that move the hip joint toward the center of the body (hip adductors). 1. Lie on your side with your left / right leg in the bottom position. Lie so your head, shoulder, hip, and knee line up. You may place your upper foot in front to help you balance. 2. Roll your hips slightly forward, so your hips are stacked directly over each other and your left / right knee is facing forward. 3. Tense the muscles in your inner thigh and lift your bottom leg 4-6 inches (10-15 cm). 4. Hold this position for __________ seconds. 5. Slowly return to the starting position. 6. Let your muscles relax completely between repetitions. Repeat __________ times. Complete this exercise __________ times a day. Straight leg raises, supine This exercise strengthens the muscles in the front of your thigh (quadriceps). 1. Lie on your back (supine position) with your left / right leg extended and your other knee bent. 2. Tense the muscles in the front of your left / right thigh. You should see your kneecap slide up  or see increased dimpling just above your knee. 3. Keep these muscles tight as you  raise your leg 4-6 inches (10-15 cm) off the floor. Do not let your knee bend. 4. Hold this position for __________ seconds. 5. Keep these muscles tense as you lower your leg. 6. Relax the muscles slowly and completely between repetitions. Repeat __________ times. Complete this exercise __________ times a day. Hip abductors, standing This exercise strengthens the muscles that move the leg and hip joint away from the center of the body (hip abductors). 1. Tie one end of a rubber exercise band or tubing to a secure surface, such as a chair, table, or pole. 2. Loop the other end of the band or tubing around your left / right ankle. 3. Keeping your ankle with the band or tubing directly opposite the secured end, step away until there is tension in the tubing or band. Hold on to a chair, table, or pole as needed for balance. 4. Lift your left / right leg out to your side. While you do this: ? Keep your back upright. ? Keep your shoulders over your hips. ? Keep your toes pointing forward. ? Make sure to use your hip muscles to slowly lift your leg. Do not tip your body or forcefully lift your leg. 5. Hold this position for __________ seconds. 6. Slowly return to the starting position. Repeat __________ times. Complete this exercise __________ times a day. Squats This exercise strengthens the muscles in the front of your thigh (quadriceps). 1. Stand in a door frame so your feet and knees are in line with the frame. You may place your hands on the frame for balance. 2. Slowly bend your knees and lower your hips like you are going to sit in a chair. ? Keep your lower legs in a straight-up-and-down position. ? Do not let your hips go lower than your knees. ? Do not bend your knees lower than told by your health care provider. ? If your hip pain increases, do not bend as low. 3. Hold this position for ___________ seconds. 4. Slowly push with your legs to return to standing. Do not use your hands to  pull yourself to standing. Repeat __________ times. Complete this exercise __________ times a day. This information is not intended to replace advice given to you by your health care provider. Make sure you discuss any questions you have with your health care provider. Document Released: 04/30/2005 Document Revised: 02/21/2018 Document Reviewed: 02/21/2018 Elsevier Patient Education  2020 [employer].

## 2019-01-30 NOTE — Progress Notes (Signed)
Name: Jaime Rice   MRN: [id number]    DOB: [birth year]   Date:01/30/2019       Progress Note  Subjective  Chief Complaint  No chief complaint on file.   HPI  Patient presents for annual CPE.  Diet: Balanced Exercise: 20-65mn daily cardio, sometimes swimming in the summer  USPSTF grade A and B recommendations    [facility]-C Score  1    2 times a week - wine or cocktail - 1 drink per sitting  Depression: Phq 9 is  negative Depression screen [place]/9 07/14/2018  Decreased Interest 0  Down, Depressed, Hopeless 0  PHQ - 2 Score 0  Altered sleeping 0  Tired, decreased energy 0  Change in appetite 0  Feeling bad or failure about yourself  0  Trouble concentrating 0  Moving slowly or fidgety/restless 0  Suicidal thoughts 0  PHQ-9 Score 0  Difficult doing work/chores Not difficult at all   Hypertension: BP Readings from Last 3 Encounters:  12/12/18 113/74  07/14/18 112/80   Obesity: Wt Readings from Last 3 Encounters:  12/12/18 135 lb 12.8 oz (61.6 kg)  07/14/18 132 lb 14.4 oz (60.3 kg)   BMI Readings from Last 3 Encounters:  12/12/18 23.31 kg/m  07/14/18 22.69 kg/m     Hep C Screening: Negative August 2020 STD testing and prevention (HIV/chl/gon/syphilis): She had negative HIV in August 2020; ok with gonorrhea/chlam today in her Pap Intimate partner violence: No concerns Sexual History/Pain during Intercourse: Denies; no bleeding after intercourse.  Menstrual History/LMP/Abnormal Bleeding: Postmenopausal x10 years.  No Vaginal bleeding since then. Incontinence Symptoms: No concerns.   Breast cancer:  - Last Mammogram: 12/21/2018 - BRCA gene screening: No family history  Osteoporosis Screening: No family history  Cervical cancer screening: We will check today  Skin cancer: No concerning moles or areas of concern for skin cancer; She does have eczema flare to the bilateral flexural aspects of the  medial wrists - will provide triamcinolone cream today. Colorectal cancer: - Seeing Dr. AVicente Males - will need to have done in 1 year. Lung cancer:  Never smoker. Low Dose CT Chest recommended if Age 58-80years, 30 pack-year currently smoking OR have quit w/in 15years. Patient does not qualify.   ECG: Denies chest pain, shortness of breath, or palpitations.  Advanced Care Planning: A voluntary discussion about advance care planning including the explanation and discussion of advance directives.  Discussed health care proxy and Living will, and the patient was able to identify a health care proxy as Husband.  Patient does not have a living will at present time. If patient does have living will, I have requested they bring this to the clinic to be scanned in to their chart.  Lipids: Lab Results  Component Value Date   CHOL 250 (H) 07/14/2018   Lab Results  Component Value Date   HDL 80 07/14/2018   Lab Results  Component Value Date   LDLCALC 151 (H) 07/14/2018   Lab Results  Component Value Date   TRIG 88 07/14/2018   Lab Results  Component Value Date   CHOLHDL 3.1 07/14/2018   No results found for: LDLDIRECT  Glucose: Glucose, Bld  Date Value Ref Range Status  07/14/2018 83 65 - 99 mg/dL Final    Comment:    .            Fasting reference interval .     Patient Active Problem  List   Diagnosis Date Noted  . Hiatal hernia 07/14/2018  . Gastroesophageal reflux disease without esophagitis 07/14/2018  . History of colon polyps 07/14/2018  . Cervicalgia 07/14/2018  . Other insomnia 07/14/2018  . Elevated LFTs 07/14/2018  . Hyperlipidemia 07/14/2018    No past surgical history on file.  No family history on file.  Social History   Socioeconomic History  . Marital status: Married    Spouse name: Duanne Moron  . Number of children: 1  . Years of education: Not on file  . Highest education level: Not on file  Occupational History  . Not on file  Social Needs  .  Financial resource strain: Not hard at all  . Food insecurity    Worry: Never true    Inability: Never true  . Transportation needs    Medical: No    Non-medical: No  Tobacco Use  . Smoking status: Never Smoker  . Smokeless tobacco: Never Used  Substance and Sexual Activity  . Alcohol use: Yes    Frequency: Never    Comment: occasional  . Drug use: Never  . Sexual activity: Not on file  Lifestyle  . Physical activity    Days per week: 5 days    Minutes per session: 30 min  . Stress: Not at all  Relationships  . Social connections    Talks on phone: More than three times a week    Gets together: More than three times a week    Attends religious service: 1 to 4 times per year    Active member of club or organization: No    Attends meetings of clubs or organizations: Never    Relationship status: Married  . Intimate partner violence    Fear of current or ex partner: No    Emotionally abused: No    Physically abused: No    Forced sexual activity: No  Other Topics Concern  . Not on file  Social History Narrative   Moved from [place] (Originally from [place] - moved to [place] after college), moved to [place] in October 2019 to live with her husband.  Has 1 daughter who lives in [place]. She has 1 dog.  She is still searching for a job.     Current Outpatient Medications:  .  famotidine (PEPCID) 40 MG tablet, Take 1 tablet (40 mg total) by mouth at bedtime., Disp: 90 tablet, Rfl: 0 .  hydrOXYzine (VISTARIL) 25 MG capsule, TAKE 1 CAPSULE (25 MG TOTAL) BY MOUTH AT BEDTIME AS NEEDED (INSOMNIA)., Disp: 90 capsule, Rfl: 1 .  omeprazole (PRILOSEC) 20 MG capsule, TAKE 1 CAPSULE BY MOUTH EVERY DAY, Disp: 90 capsule, Rfl: 0  No Known Allergies   ROS  Constitutional: Negative for fever or weight change.  Respiratory: Negative for cough and shortness of breath.   Cardiovascular: Negative for chest pain or palpitations.  Gastrointestinal: Negative for abdominal pain, no bowel changes.   Musculoskeletal: Negative for gait problem or joint swelling.  She notes ongoing LEFT hip pain that has been recurrent, however she is having a flare over the last few months.  Not taking any medication for this issue.  We will try home exercise first. Skin: Negative for rash.  Neurological: Negative for dizziness or headache.  No other specific complaints in a complete review of systems (except as listed in HPI above).   Objective  There were no vitals filed for this visit.  There is no height or weight on file to calculate BMI.  Physical Exam  Constitutional: Patient appears well-developed and well-nourished. No distress.  HENT: Head: Normocephalic and atraumatic. Ears: B TMs ok, no erythema or effusion; Nose: Nose normal. Mouth/Throat: Oropharynx is clear and moist. No oropharyngeal exudate.  Eyes: Conjunctivae and EOM are normal. Pupils are equal, round, and reactive to light. No scleral icterus.  Neck: Normal range of motion. Neck supple. No JVD present. No thyromegaly present.  Cardiovascular: Normal rate, regular rhythm and normal heart sounds.  No murmur heard. No BLE edema. Pulmonary/Chest: Effort normal and breath sounds normal. No respiratory distress. Abdominal: Soft. Bowel sounds are normal, no distension. There is no tenderness. no masses Breast: no lumps or masses, no nipple discharge or rashes FEMALE GENITALIA:  External genitalia normal External urethra normal Vaginal vault normal without discharge or lesions Cervix normal without discharge or lesions Bimanual exam normal without masses RECTAL: no rectal masses or hemorrhoids Musculoskeletal: Normal range of motion, no joint effusions. No gross deformities Neurological: he is alert and oriented to person, place, and time. No cranial nerve deficit. Coordination, balance, strength, speech and gait are normal.  Skin: Skin is warm and dry. No erythema. There is dry erythematous scaling skin to the bilateral flexural  aspects of the wrists. Psychiatric: Patient has a normal mood and affect. behavior is normal. Judgment and thought content normal.  Recent Results (from the past 2160 hour(s))  Hepatic function panel     Status: Abnormal   Collection Time: 12/12/18  2:24 PM  Result Value Ref Range   Total Protein 7.0 6.0 - 8.5 g/dL   Albumin 4.5 3.8 - 4.9 g/dL   Bilirubin Total 0.2 0.0 - 1.2 mg/dL   Bilirubin, Direct 0.06 0.00 - 0.40 mg/dL   Alkaline Phosphatase 80 39 - 117 IU/L   AST 66 (H) 0 - 40 IU/L   ALT 38 (H) 0 - 32 IU/L  H. pylori breath test     Status: None   Collection Time: 12/12/18  2:26 PM  Result Value Ref Range   H pylori Breath Test Negative Negative  Hepatitis A Ab, Total     Status: Abnormal   Collection Time: 12/21/18  2:26 PM  Result Value Ref Range   hep A Total Ab Positive (A) Negative  Hepatitis B Core Antibody, total     Status: None   Collection Time: 12/21/18  2:26 PM  Result Value Ref Range   Hep B Core Total Ab Negative Negative  Hepatitis B e antibody     Status: None   Collection Time: 12/21/18  2:26 PM  Result Value Ref Range   Hep B E Ab Negative Negative  Hepatitis B Surface AntiBODY     Status: None   Collection Time: 12/21/18  2:26 PM  Result Value Ref Range   Hep B Surface Ab, Qual Reactive     Comment:               Non Reactive: Inconsistent with immunity,                             less than 10 mIU/mL               Reactive:     Consistent with immunity,                             greater than 9.9 mIU/mL   Hepatitis B Surface AntiGEN  Status: None   Collection Time: 12/21/18  2:26 PM  Result Value Ref Range   Hepatitis B Surface Ag Negative Negative  Hepatitis C Antibody     Status: None   Collection Time: 12/21/18  2:26 PM  Result Value Ref Range   Hep C Virus Ab 0.1 0.0 - 0.9 s/co ratio    Comment:                                   Negative:     < 0.8                              Indeterminate: 0.8 - 0.9                                    Positive:     > 0.9  The CDC recommends that a positive HCV antibody result  be followed up with a HCV Nucleic Acid Amplification  test (353614).   HIV antibody (with reflex)     Status: None   Collection Time: 12/21/18  2:26 PM  Result Value Ref Range   HIV Screen 4th Generation wRfx Non Reactive Non Reactive  Iron, TIBC and Ferritin Panel     Status: None   Collection Time: 12/21/18  2:26 PM  Result Value Ref Range   Total Iron Binding Capacity 297 250 - 450 ug/dL   UIBC 203 131 - 425 ug/dL   Iron 94 27 - 159 ug/dL   Iron Saturation 32 15 - 55 %   Ferritin 144 15 - 150 ng/mL  Mitochondrial/smooth muscle ab pnl     Status: None   Collection Time: 12/21/18  2:26 PM  Result Value Ref Range   Smooth Muscle Ab 7 0 - 19 Units    Comment:                  Negative                     0 - 19                  Weak positive               20 - 30                  Moderate to strong positive     >30  Actin Antibodies are found in 52-85% of patients with  autoimmune hepatitis or chronic active hepatitis and  in 22% of patients with primary biliary cirrhosis.    Mitochondrial Ab <20.0 0.0 - 20.0 Units    Comment:                                 Negative    0.0 - 20.0                                 Equivocal  20.1 - 24.9                                 Positive         >  24.9 Mitochondrial (M2) Antibodies are found in 90-96% of patients with primary biliary cirrhosis.   AntiMicrosomal Ab-Liver / Kidney     Status: None   Collection Time: 12/21/18  2:26 PM  Result Value Ref Range   LKM1 Ab 0.8 0.0 - 20.0 Units    Comment:                                 Negative    0.0 - 20.0                                 Equivocal  20.1 - 24.9                                 Positive         >24.9 LKM type 1 antibodies are detected in patients with autoimmune hepatitis type 2 and in up to 8% of patients with chronic HCV infection.   Antinuclear Antib (ANA)     Status: None   Collection Time:  12/21/18  2:26 PM  Result Value Ref Range   Anti Nuclear Antibody (ANA) Negative Negative  Celiac Disease Panel     Status: None   Collection Time: 12/21/18  2:26 PM  Result Value Ref Range   Endomysial IgA Negative Negative   Transglutaminase IgA <2 0 - 3 U/mL    Comment:                               Negative        0 -  3                               Weak Positive   4 - 10                               Positive           >10  Tissue Transglutaminase (tTG) has been identified  as the endomysial antigen.  Studies have demonstr-  ated that endomysial IgA antibodies have over 99%  specificity for gluten sensitive enteropathy.    IgA/Immunoglobulin A, Serum 219 87 - 352 mg/dL  Alpha-1-antitrypsin     Status: None   Collection Time: 12/21/18  2:26 PM  Result Value Ref Range   A-1 Antitrypsin 115 101 - 187 mg/dL  Immunoglobulins, QN, A/E/G/M     Status: None   Collection Time: 12/21/18  2:26 PM  Result Value Ref Range   IgG (Immunoglobin G), Serum 1,000 586 - 1,602 mg/dL   IgM (Immunoglobulin M), Srm 114 26 - 217 mg/dL   IgE (Immunoglobulin E), Serum 147 6 - 495 IU/mL  Ceruloplasmin     Status: None   Collection Time: 12/21/18  2:26 PM  Result Value Ref Range   Ceruloplasmin 24.6 19.0 - 39.0 mg/dL  CK (Creatine Kinase)     Status: None   Collection Time: 12/21/18  2:26 PM  Result Value Ref Range   Total CK 98 32 - 182 U/L  Hepatitis B e antigen     Status: None   Collection Time: 12/21/18  2:26 PM  Result  Value Ref Range   Hep B E Ag Negative Negative    Fall Risk: Fall Risk  07/14/2018  Falls in the past year? 0  Number falls in past yr: 0  Injury with Fall? 0   Assessment & Plan  1. Well woman exam - Cytology - PAP  2. Cervical cancer screening - Cytology - PAP  3. Need for influenza vaccination - Flu Vaccine QUAD 6+ mos PF IM (Fluarix Quad PF)  4. Flexural eczema - triamcinolone cream (KENALOG) 0.1 %; Apply 1 application topically 2 (two) times daily.   Dispense: 30 g; Refill: 1  -USPSTF grade A and B recommendations reviewed with patient; age-appropriate recommendations, preventive care, screening tests, etc discussed and encouraged; healthy living encouraged; see AVS for patient education given to patient -Discussed importance of 150 minutes of physical activity weekly, eat two servings of fish weekly, eat one serving of tree nuts ( cashews, pistachios, pecans, almonds.Marland Kitchen) every other day, eat 6 servings of fruit/vegetables daily and drink plenty of water and avoid sweet beverages.

## 2019-02-06 LAB — CYTOLOGY - PAP
Diagnosis: UNDETERMINED — AB
High risk HPV: POSITIVE — AB

## 2019-02-08 ENCOUNTER — Other Ambulatory Visit: Payer: Self-pay | Admitting: Family Medicine

## 2019-02-08 DIAGNOSIS — R87810 Cervical high risk human papillomavirus (HPV) DNA test positive: Secondary | ICD-10-CM

## 2019-02-08 DIAGNOSIS — R87610 Atypical squamous cells of undetermined significance on cytologic smear of cervix (ASC-US): Secondary | ICD-10-CM

## 2019-02-09 ENCOUNTER — Encounter: Payer: Self-pay | Admitting: Family Medicine

## 2019-02-12 ENCOUNTER — Other Ambulatory Visit: Payer: Self-pay

## 2019-02-12 ENCOUNTER — Telehealth: Payer: Self-pay | Admitting: Obstetrics & Gynecology

## 2019-02-12 ENCOUNTER — Encounter: Payer: Self-pay | Admitting: Gastroenterology

## 2019-02-12 ENCOUNTER — Ambulatory Visit: Payer: 59 | Admitting: Gastroenterology

## 2019-02-12 VITALS — BP 109/75 | HR 83 | Temp 98.2°F | Ht 64.0 in | Wt 133.6 lb

## 2019-02-12 DIAGNOSIS — R7989 Other specified abnormal findings of blood chemistry: Secondary | ICD-10-CM

## 2019-02-12 DIAGNOSIS — K31A0 Gastric intestinal metaplasia, unspecified: Secondary | ICD-10-CM

## 2019-02-12 DIAGNOSIS — K3189 Other diseases of stomach and duodenum: Secondary | ICD-10-CM

## 2019-02-12 DIAGNOSIS — K760 Fatty (change of) liver, not elsewhere classified: Secondary | ICD-10-CM | POA: Diagnosis not present

## 2019-02-12 DIAGNOSIS — Z86010 Personal history of colonic polyps: Secondary | ICD-10-CM | POA: Diagnosis not present

## 2019-02-12 DIAGNOSIS — K219 Gastro-esophageal reflux disease without esophagitis: Secondary | ICD-10-CM | POA: Diagnosis not present

## 2019-02-12 NOTE — Progress Notes (Signed)
Wyline Mood MD, MRCP(U.K) [address]  [address]  [place], [place] [address]  Main: [phone]  Fax: [phone]   Primary Care Physician: Doren Custard, [name]  Primary Gastroenterologist:  Dr. Wyline Mood   Follow-up for GERD and elevated LFTs  HPI: Jaime Rice is a 58 y.o. female   Summary of history :  She is initially referred and seen in 12/14/2018 for GERD, abnormal LFTs, colon cancer screening and past history of colon polyps.  She is originally from [place] and been in the [place] for the past few months.  Past GI records from [place] indicate a diagnosis of gastric intestinal metaplasia and H. pylori treatment in the past over 2 years back..  No history of abdominal pain.  History of colon polyps and was told she needed a colonoscopy every year but her colonoscopy was last in 2018 which she states verbally was normal.  History of acid reflux and has been on omeprazole, rabeprazole, she was concerned of the side effects and wanted to know what would be the best thing to do at the long-term treatment.  Mildly elevated transaminases in the past.  He denied any active alcohol use   Labs in 07/14/2018 minimally elevated AST of 56.  Hepatitis C antibody negative.  HIV negative. Elevated LDL and total cholesterol.  CBC normal.   Interval history 12/14/2018-02/12/2019  12/12/2018: LFTs: AST 66, ALT 38, total bilirubin 0.2.,  H. pylori breath test negative 8/27 2020: Hepatitis A total antibody positive,Hepatitis B core total antibody, hepatitis B E antibody: Negative hepatitis B surface antibody reactive.  Hepatitis B surface antigen, hepatitis C virus antibody, HIV, negative.  Iron studies normal, ANA, smooth muscle antibody, AMA, LK M antibody celiac serology negative.  Alpha-1 antitrypsin, ceruloplasmin, immunoglobulins, CK normal. 01/16/2019: Right upper quadrant ultrasound: Normal study  Explained to her that the cause of her abnormal LFTs most likely to  nonalcoholic fatty liver disease.  She is taking her PPI and see if that with that she has occasional breakthrough symptoms which are relieved completely with Gaviscon.  Informed her that the H. pylori breath test was negative.  Current Outpatient Medications  Medication Sig Dispense Refill  . famotidine (PEPCID) 40 MG tablet Take 1 tablet (40 mg total) by mouth at bedtime. 90 tablet 0  . triamcinolone cream (KENALOG) 0.1 % Apply 1 application topically 2 (two) times daily. 30 g 1   No current facility-administered medications for this visit.     Allergies as of 02/12/2019  . (No Known Allergies)    ROS:  General: Negative for anorexia, weight loss, fever, chills, fatigue, weakness. ENT: Negative for hoarseness, difficulty swallowing , nasal congestion. CV: Negative for chest pain, angina, palpitations, dyspnea on exertion, peripheral edema.  Respiratory: Negative for dyspnea at rest, dyspnea on exertion, cough, sputum, wheezing.  GI: See history of present illness. GU:  Negative for dysuria, hematuria, urinary incontinence, urinary frequency, nocturnal urination.  Endo: Negative for unusual weight change.    Physical Examination:   There were no vitals taken for this visit.  General: Well-nourished, well-developed in no acute distress.  Eyes: No icterus. Conjunctivae pink. Mouth: Oropharyngeal mucosa moist and pink , no lesions erythema or exudate. Lungs: Clear to auscultation bilaterally. Non-labored. Heart: Regular rate and rhythm, no murmurs rubs or gallops.  Abdomen: Bowel sounds are normal, nontender, nondistended, no hepatosplenomegaly or masses, no abdominal bruits or hernia , no rebound or guarding.   Extremities: No lower extremity edema. No clubbing or deformities.  Neuro: Alert and oriented x 3.  Grossly intact. Skin: Warm and dry, no jaundice.   Psych: Alert and cooperative, normal mood and affect.   Imaging Studies: US Abdomen Limited Ruq  Result Date:  01/16/2019 CLINICAL DATA:  Elevated liver enzymes EXAM: ULTRASOUND ABDOMEN LIMITED RIGHT UPPER QUADRANT COMPARISON:  None. FINDINGS: Gallbladder: No gallstones or wall thickening visualized. There is no pericholecystic fluid. No sonographic Murphy sign noted by sonographer. Common bile duct: Diameter: 4 mm. No intrahepatic or extrahepatic biliary duct dilatation. Liver: No focal lesion identified. Within normal limits in parenchymal echogenicity. Portal vein is patent on color Doppler imaging with normal direction of blood flow towards the liver. Other: None. IMPRESSION: Study within normal limits. Electronically Signed   By: Lowella Grip III M.D.   On: 01/16/2019 14:30    Assessment and Plan:   Jaime Rice is a 58 y.o. y/o female to follow-up for acid reflux, abnormal LFTs , gastric intestinal metaplasia and past history of colon polyps.  Work-up for liver disease including for viral hepatitis, autoimmune liver disease and genetic disorders are negative.  Likely nonalcoholic fatty liver disease.   Plan  1.  NAFLD: Patient information on Mediterranean diet provided in [place] language.  Discussed that the emphasis for NAFLD is to address cardiovascular risk factors.  He did discuss with me that one of her relatives died from a sudden heart attack.  We discussed exercise, healthy eating, addressing hyperlipidemia.  She denies suffering from hypertension at this point of time.  She will modify her diet and she will discuss with her PCP if her cholesterol is still high for medication at her next visit. 2. GERD-continue on Pepcid 40 mg a day.  EGD for gastric mapping for intestinal metaplasia in January 2021.. 3.  Colonoscopy for surveillance due to prior history of colon polyps in January 2021.  Discussed once again risks versus benefits regarding the procedures.   I have discussed alternative options, risks & benefits,  which include, but are not limited to, bleeding, infection,  perforation,respiratory complication & drug reaction.  The patient agrees with this plan & written consent will be obtained.     Dr Jonathon Bellows  MD,MRCP [facility]) Follow up in 6 months

## 2019-02-12 NOTE — Patient Instructions (Signed)
???? ??????? ? ???????????? ??????????? ???? ? ??????????? Fat and Cholesterol Restricted Eating Plan ?????????? ?????????? ????? ? ??????????? ? ??????? ??????? ????? ??????? ???????? ?? ?????????. ????? ?????????? ????????? ??????? ???????? ???????????? ??????? ????? ? ??????????? ? ???????? ?????. ????????? ????? ????? ???????? ???????? ???????????? ???????????. ??? ???? ????? ??????????????? ???? ???????, ??????????:  ????? ????: ______% ??? ?????? ?? ?????? ?????????? ???????????? ??????? ? ????.  ?????????? ????: ______% ??? ?????? ?? ?????? ?????????? ???????????? ??????? ? ????.  ??????????: ????? _________?? ? ????.  ?????????: ______? ? ????. ?????? ?????? ?? ?????????? ????? ?????? ???????????? ??????? ????  ?? ????? ??? ????????? ??????? ?? ?????? ?????? ?????: ? ????????? ???????? ????? ??????? ??????? ? ???????? ?? ?????? ? ??????? ??????. ? ????????? ???????? ????? ??????? ??????????????? ??????????. ? ????????? ???????? ????? ??????? ????????? (????????) ????????? ??????????.  ???????????? ????, ??????? ?????-3 ??????, ??????? ??? ???? ? ??????. ? ??? ????????? ????????, ?????, ??????? ? ??????.  ????? ?????? ?????????, ?????????? ?????????, ????? ??? ?????????????? ????????, ???????, ??????, ????????, ???????, ????? ? ??????. ????? ??????   ???? ??? ??????????, ?????????? ? ?????? ?????, ????? ???????? ???.  ?????????? ??: ? ????????? ???????, ? ?????? ??????? ???????? ?????. ? ??????? ????. ? ?????????, ?????????? ???????? ??????????????????? ????.  ?????????? ???????????? ???????? ?? 1?????? ? ???? ??? ??????, ??????? ?? ?????????, ? ?? 2?????? ? ???? ??? ??????. ???? ??????- ??? 12????? (????? 360??) ????, 5????? (????? 150??) ???? ??? 1 ????? (?????45 ??) ???????? ????????? ???????. ?????? ???????? ?? ????????? ???????  ?????????? ???????? ?? ?????????, ????? ?????? ??????????: ? ????? ?????. ? ???????? ??????????????????? ?????. ? ?????????? ?????  (?) ? ?????? ??????. ? ??????????? (??) ? ?????? ??????. ? ????????? (?) ? ?????? ??????.  ????????? ????????, ?????????? ???????? ??? ???????? ????, ????? ???: ? ???????????????? ????. ? ???????????????? ????. ? ?????-3 ????.  ????????? ???????? ????????, ?????????? ??????? ?????. ????????? ???????? ?? ????? ??????? ? ???????? ??????? ????? ? ?????? ????????????. ????????????? ????  ???????? ???? ? ?????????????? ???????, ?? ????????? ?????????? ???????? ?????????? ?????. ? ??? ????????? ?????????, ?????, ??????? ?? ????? ? ?? ???????? ????.  ????? ?????? ???? ????????? ?????????????. ???? ????????? ? ?????????? ? ??????????.  ????????? ????????????? ???? ? ?????????????? ?????????? ?????, ????? ??? ????????? ?????, ??????, ????????? ?????, ????????????? ????? ? ????????? ?????. ????????????? ???????? ???????  ??????  ??? ??????, ???????????????? (? ??????????? ????) ??? ???????????? ??????. ?????  ?????? ??? ???????????? ????? (?????, ???????, ?????????? ??? ?????????????? ?? ?????). ?????? ?? ?????? ? ??????? ??????. ????????  ??????? ?????, ????? ??? ?????????????? ??????? ??? ???? ?? ???????? ?????, ????? ??????? (???????), ???? ? ????????. ????????? ???????, ??????, ??????, ?????? ??? ?????????? ???. ???????? ?? ???????? ??? ?????????????? ????????? ????. ???? ? ?????? ???????? ????????  ??????? ???? (85% ??? ????? ???????), ???????????? ???????? ??? ???????? ? ?????????? ???????? ???????????. ?????? ??? ??????? ??? ????. ??????? ???? ??? ???? ?? ???????. ??????? ? ?????????? ???????? ???????????. ??? ???? ???? ? ?????????????. ?????? ?????. ??????? ????, ????? ??? ????????. ????????? ????? ??? ???????. ????????? ???????????????? ??????. ???????? ????? ??? ?????????? ?????? ? ?????. ???????? ????????  ???????? ??? ???????????? ???????? ????????, ????? ??? ???????????? ??? 1%-? ??????, 2%-? ??? ???????????? ????, ???????? ??? ???????????? ??????? ??? ??????, ???? ???????  ???????? ? ???????????? ??????. ???? ? ?????  ???????????? ????? ??? ????? ?????. ?????? ??? ???????????? ??????? ? ???????? ????????. ???????. ?????????, ????????, ????????? ??? ?????????? ?????. ????????????? ???? ?? ???????? ?????? ??????? ????????? ??????? ? ????????, ??????? ?? ?????? ????. ??? ????????? ?????????????? ?????????? ?????????? ? ?????-?????????. ????????, ??????? ??????? ???????? ??????  ???????????????? ?????? ? ?????? ??????. ?????? ?? ???????? ??? ?????? ?? ?????. ?????????? ??????. ?????  ?????, ?????????????? ? ????, ????????? ????? ??? ? ????? ?? ????????????? ?????. ??????? ?????. ????????  ????? ????. ????? ????????. ????? ???. ?????????? ????. ???????, ???????? ? ?????????. ????? ??????? ? ???????, ?????????? ????? ???? ? ??????????????????? ????. ???? ? ?????? ???????? ????????  ?????? ????? ????. ?????, ??????? ????????, ?????, ???????, ????????? ???????? ???????, ??????, ???????, ????, ???????, ????????? ? ??????????? ????????? ? ?????? ???????. ?????? ? ?????? ???????????. ????? ???? ? ?????? ??????. ?????? ? ??????? ? ?????. ??????? ????. ???????? ????????  ??????? ???  2%-? ??????, ??????, ???????????????????? ? ????????? ???. ?????????????? ????. ?????????????? ??? ???????????? ??????. ???? ?????? ????????. ?????????? ?????? ? ??????? ????????. ????????? ???, ?????? ????? ? ?????? ??????. ???????  ????????. ??????? ? ??????????? ??????, ????????, ???????????? ???????, ??????? ? ????????? ???????. ???? ? ?????  ?????, ??????? ????????, ?????? ????, ?????????? ???, ???????? ????? ??? ????. ?????????, ????????????? ? ????????? ?????. ???????? ? ???????  ?????????? ?????, ?????, ??? ? ??????. ???????. ??????? ? ????. ?????. ??????? ????. ???????, ??????, ?????, ???????, ????? ? ?????????. ????????????? ???? ????? ???? ???????? ??????? ????????? ??????? ? ????????, ??????? ??? ??????? ????????. ??? ????????? ?????????????? ?????????? ?????????? ?  ?????-?????????. ??????  ????? ?????????? ????????? ??????? ???????? ???????????? ??????? ????? ? ??????????? ? ???????? ?????. ????????? ????? ????? ???????? ???????? ???????????? ???????????.  ?? ????? ?????? ???? ????????? ???????? ????? ??????? ??????? ? ???????? ?? ?????? ? ??????? ??????.  ????? ?????? ????????? ? ??????? ??????????? ?????????, ????? ??? ?????????????? ????????, ???????, ??????, ????????, ???????, ????? ? ??????.  ?????????? ??????????? ??????, ?????????? ?????, ???????? ? ??????? ????. ??? ?????????? ?? ????? ???????? ??????, ??????????????? ????? ??????. ??????????? ???????? ????? ???????????? ??? ??????? ? ????? ??????? ??????. Document Released: 12/12/2017 Document Revised: 12/12/2017 [employer] Patient Education  [employer].

## 2019-02-12 NOTE — Telephone Encounter (Signed)
[facility] referring for ASCUS with positive high risk HPV cervical. Called and left voicemail for patient to call back to be schedule

## 2019-02-26 ENCOUNTER — Other Ambulatory Visit (HOSPITAL_COMMUNITY)
Admission: RE | Admit: 2019-02-26 | Discharge: 2019-02-26 | Disposition: A | Discharge status: Home / Self Care | Payer: 59 | Source: Ambulatory Visit | Attending: Obstetrics and Gynecology | Admitting: Obstetrics and Gynecology

## 2019-02-26 ENCOUNTER — Other Ambulatory Visit: Payer: Self-pay

## 2019-02-26 ENCOUNTER — Encounter: Payer: Self-pay | Admitting: Obstetrics and Gynecology

## 2019-02-26 ENCOUNTER — Ambulatory Visit (INDEPENDENT_AMBULATORY_CARE_PROVIDER_SITE_OTHER): Payer: 59 | Admitting: Obstetrics and Gynecology

## 2019-02-26 VITALS — BP 126/74 | HR 78 | Wt 135.0 lb

## 2019-02-26 DIAGNOSIS — N870 Mild cervical dysplasia: Secondary | ICD-10-CM | POA: Diagnosis not present

## 2019-02-26 DIAGNOSIS — R87810 Cervical high risk human papillomavirus (HPV) DNA test positive: Secondary | ICD-10-CM | POA: Insufficient documentation

## 2019-02-26 DIAGNOSIS — R87610 Atypical squamous cells of undetermined significance on cytologic smear of cervix (ASC-US): Secondary | ICD-10-CM

## 2019-02-26 NOTE — Patient Instructions (Signed)
80% of the population will have exposure to human papilloma virus (HPV) during their lifetime.  HPV is a large group of viruses, and are also the causative virus for common warts and genital warts.  The pap smear tests for 13 high risk HPV strains that have some association with cervical cancer, but do not cause visual lesion such as warts.  HPV type 16 and 18 have the highest association with cervical cancer.  The vast majority of HPV infections will be uncomplicated at clear spontaneously in 12-18 months in non-immunocompromised patients.  Patient with compromised immune systems, those taking immunosuppressive drugs, or smoker have shown to have a lower clearance rate and higher persistence of HPV infection.    Currently there are no FDA approved treatments to promote HPV clearance.  Gardasil vaccination is available to prevent HPV infection, but this is only beneficial pre-exposure.  Abstaining from intercourse will not increase clearance  If properly followed HPV should not lead to cervical cancer.   The goal of screening is to identify patient who develop precancerous lesions of the cervix and treat these prior to progression to frank cervical cancer.  The incidence of cervical cancer is 7 cases per 100,000 women in the [place] a year.  This relatively low rate is in part due to universal screening as well as vaccination efforts.       

## 2019-02-26 NOTE — Progress Notes (Signed)
Obstetrics & Gynecology Office Visit   Chief Complaint:  Chief Complaint  Patient presents with   Colposcopy    Referred by [facility] ASCUS HPV positive    History of Present Illness:Jaime Rice is a 58 y.o. woman who presents today in consultation at the request Maurice Small, [name] for recent abnormal pap. Last pap obtained on 01/30/2019 revealed ASCUS HPV positive pap.  Prior pap smear have all been normal per patient in [place].  She is re-married so new Husband.  Her mother had uterine cancer.  Review of Systems: Review of Systems  Constitutional: Negative.   Genitourinary: Negative.   Skin: Negative.    Past Medical History:  Past Medical History:  Diagnosis Date   Cervical spine pain     Past Surgical History:  History reviewed. No pertinent surgical history.  Gynecologic History: No LMP recorded. Patient is postmenopausal.  Obstetric History: [id number]  Family History:  Family History  Problem Relation Age of Onset   Uterine cancer Mother 6   Prostate cancer Father 69    Social History:  Social History   Socioeconomic History   Marital status: Married    Spouse name: Dierdre Forth   Number of children: 1   Years of education: Not on file   Highest education level: Not on file  Occupational History   Not on file  Social Needs   Financial resource strain: Not hard at all   Food insecurity    Worry: Never true    Inability: Never true   Transportation needs    Medical: No    Non-medical: No  Tobacco Use   Smoking status: Never Smoker   Smokeless tobacco: Never Used  Substance and Sexual Activity   Alcohol use: Yes    Frequency: Never    Comment: occasional   Drug use: Never   Sexual activity: Yes    Partners: Male    Birth control/protection: Post-menopausal  Lifestyle   Physical activity    Days per week: 5 days    Minutes per session: 30 min   Stress: Not at all  Relationships   Social connections    Talks on  phone: More than three times a week    Gets together: More than three times a week    Attends religious service: 1 to 4 times per year    Active member of club or organization: No    Attends meetings of clubs or organizations: Never    Relationship status: Married   Intimate partner violence    Fear of current or ex partner: No    Emotionally abused: No    Physically abused: No    Forced sexual activity: No  Other Topics Concern   Not on file  Social History Narrative   Moved from [place] (Originally from [place] - moved to [place] after college), moved to [place] in October 2019 to live with her husband.  Has 1 daughter who lives in [place]. She has 1 dog.  She is still searching for a job.    Allergies:  No Known Allergies  Medications: Prior to Admission medications   Medication Sig Start Date End Date Taking? Authorizing Provider  famotidine (PEPCID) 40 MG tablet Take 1 tablet (40 mg total) by mouth at bedtime. 12/12/18 03/12/19 Yes Wyline Mood, MD  triamcinolone cream (KENALOG) 0.1 % Apply 1 application topically 2 (two) times daily. 01/30/19  Yes Doren Custard, [name]    Physical Exam Vitals:  Vitals:  02/26/19 0923  BP: 126/74  Pulse: 78   No LMP recorded. Patient is postmenopausal.  General: NAD, well nourished, appears stated age HEENT: normocephalic, anicteric Thyroid: no enlargement, no palpable nodules Pulmonary: No increased work of breathing Extremities: no edema, erythema, or tenderness Neurologic: Grossly intact Psychiatric: mood appropriate, affect full  Female chaperone present for pelvic and breast  portions of the physical exam  Cytology - PAP     Status: Abnormal   Collection Time: 01/30/19  9:07 AM  Result Value Ref Range   High risk HPV Positive (A)    Adequacy      Satisfactory for evaluation; transformation zone component PRESENT.   Diagnosis (A)     - Atypical squamous cells of undetermined significance (ASC-US)    GYNECOLOGY CLINIC  COLPOSCOPY PROCEDURE NOTE  58 y.o. [id number] here for colposcopy for ASCUS with POSITIVE high risk HPV  pap smear on 01/30/2019. Discussed underlying role for HPV infection in the development of cervical dysplasia, its natural history and progression/regression, need for surveillance.  Is the patient  pregnant: No LMP: No LMP recorded. Patient is postmenopausal. Smoking status:  reports that she has never smoked. She has never used smokeless tobacco. Contraception: post menopausal status High risk partner:No but new Husband last year History of STD:  No Future fertility desired:  No  Patient given informed consent, signed copy in the chart, time out was performed.  The patient was position in dorsal lithotomy position. Speculum was placed the cervix was visualized.   After application of acetic acid colposcopic inspection of the cervix was undertaken.   Colposcopy adequate, full visualization of transformation zone: Yes no visible lesions; corresponding biopsies obtained.   ECC specimen obtained:  Yes  All specimens were labeled and sent to pathology.   Patient was given post procedure instructions.  Will follow up pathology and manage accordingly.  Routine preventative health maintenance measures emphasized.      Assessment: 58 y.o. G3P0020 follow up for ASCUS HPV positive pap  Plan: Problem List Items Addressed This Visit    None    Visit Diagnoses    ASCUS with positive high risk HPV cervical    -  Primary   Relevant Orders   Surgical pathology      - Colposcopy performed today  - I had a lengthly discussion with Elray McgregorLarisza Pina  regarding the cause of dysplasia of the lower genital tract (including immunosuppression in the setting of HPV exposure and tobacco exposure). I explained the potential for progression to invasive malignancy, the recurrent nature of these lesions (and the need for close continued followup). Results of today's pap will dictate need for further  evaluation and follow up per ASCCP guidelines..  We discussed that 80% of the population will have exposure to human papilloma virus (HPV) during their lifetime.  HPV is a large group of viruses, and are also the causative virus for common warts and genital warts.  The pap smear tests for 13 high risk HPV strains that have some association with cervical cancer, but do not cause visual lesion such as warts.  HPV type 16 and 18 have the highest association with cervical cancer.  The vast majority of HPV infections will be uncomplicated at clear spontaneously in 12-18 months in non-immunocompromised patients.  Patient with compromised immune systems, those taking immunosuppressive drugs, or smoker have shown to have a lower clearance rate and higher persistence of HPV infection.    Currently there are no FDA approved treatments to promote  HPV clearance.  Gardasil vaccination is available to prevent HPV infection, but this is only beneficial pre-exposure.  Abstaining from intercourse will not increase clearance  Lastly we stressed that if properly followed HPV should not lead to cervical cancer.   The goal of screening is to identify patient who develop precancerous lesions of the cervix and treat these prior to progression to frank cervical cancer.  The incidence of cervical cancer is 7 cases per 100,000 women in the [place] a year.  This relatively low rate is in part due to universal screening as well as vaccination efforts.     - She is comfortable with the plan and had her questions answered.  - Return in about 1 year (around 02/26/2020) for annual/repeat pap.   Malachy Mood, MD, [facility], [facility] 02/26/2019, 9:55 AM

## 2019-03-02 LAB — SURGICAL PATHOLOGY

## 2019-03-11 ENCOUNTER — Other Ambulatory Visit: Payer: Self-pay | Admitting: Gastroenterology

## 2019-05-02 ENCOUNTER — Telehealth: Payer: Self-pay | Admitting: Gastroenterology

## 2019-05-02 NOTE — Telephone Encounter (Signed)
Patient called & would like to cancel her procedures and r/s to May not on a Monday or Wednesday.

## 2019-05-04 NOTE — Telephone Encounter (Signed)
Spoke with pt and informed her that we are not able to schedule procedure that far out at this time, I explained that I will contact her closer to May to reschedule. Pt understands and agrees.

## 2019-05-17 ENCOUNTER — Ambulatory Visit: Admit: 2019-05-17 | Payer: 59 | Admitting: Gastroenterology

## 2019-05-17 SURGERY — COLONOSCOPY WITH PROPOFOL
Anesthesia: General

## 2019-07-24 ENCOUNTER — Other Ambulatory Visit: Payer: Self-pay

## 2019-07-24 ENCOUNTER — Ambulatory Visit (INDEPENDENT_AMBULATORY_CARE_PROVIDER_SITE_OTHER): Payer: Self-pay

## 2019-07-24 DIAGNOSIS — L988 Other specified disorders of the skin and subcutaneous tissue: Secondary | ICD-10-CM

## 2019-08-21 ENCOUNTER — Ambulatory Visit: Payer: 59

## 2019-09-17 ENCOUNTER — Ambulatory Visit: Payer: 59 | Admitting: Family Medicine

## 2019-09-19 ENCOUNTER — Telehealth: Payer: Self-pay

## 2019-09-19 NOTE — Telephone Encounter (Signed)
-----   Message from Isa Rankin, CMA sent at 05/04/2019 10:57 AM EST ----- Regarding: Reschedule EGD/Colon Contact pt to schedule EGD/ Colonoscopy in May. (refer to 05-02-19 phone encounter)

## 2019-09-19 NOTE — Telephone Encounter (Signed)
Called and left a message for call back to scheduled for EGD and colonoscopy

## 2019-09-20 NOTE — Telephone Encounter (Signed)
Called and left a message for call back. Sent mychart message  °

## 2019-09-25 ENCOUNTER — Other Ambulatory Visit: Payer: Self-pay

## 2019-09-25 DIAGNOSIS — K31A0 Gastric intestinal metaplasia, unspecified: Secondary | ICD-10-CM

## 2019-09-25 DIAGNOSIS — Z86010 Personal history of colonic polyps: Secondary | ICD-10-CM

## 2019-09-25 MED ORDER — NA SULFATE-K SULFATE-MG SULF 17.5-3.13-1.6 GM/177ML PO SOLN: 1.0000 | Freq: Once | ORAL | 0 refills | Status: AC

## 2019-09-25 NOTE — Telephone Encounter (Signed)
Spoke with pt and was able to schedule procedures. 

## 2019-10-01 ENCOUNTER — Ambulatory Visit: Payer: BC Managed Care – PPO | Admitting: Family Medicine

## 2019-10-01 ENCOUNTER — Other Ambulatory Visit: Payer: Self-pay

## 2019-10-01 ENCOUNTER — Encounter: Payer: Self-pay | Admitting: Family Medicine

## 2019-10-01 VITALS — HR 89 | Temp 97.9°F | Resp 14 | Ht 64.0 in | Wt 133.7 lb

## 2019-10-01 DIAGNOSIS — R7989 Other specified abnormal findings of blood chemistry: Secondary | ICD-10-CM | POA: Diagnosis not present

## 2019-10-01 DIAGNOSIS — M25552 Pain in left hip: Secondary | ICD-10-CM | POA: Diagnosis not present

## 2019-10-01 DIAGNOSIS — L2082 Flexural eczema: Secondary | ICD-10-CM

## 2019-10-01 DIAGNOSIS — E785 Hyperlipidemia, unspecified: Secondary | ICD-10-CM

## 2019-10-01 DIAGNOSIS — R21 Rash and other nonspecific skin eruption: Secondary | ICD-10-CM

## 2019-10-01 MED ORDER — TRIAMCINOLONE ACETONIDE 0.1 % EX CREA: 1.0000 "application " | TOPICAL_CREAM | Freq: Two times a day (BID) | CUTANEOUS | 1 refills | Status: DC

## 2019-10-01 MED ORDER — MELOXICAM 7.5 MG PO TABS: 7.5000 mg | ORAL_TABLET | Freq: Every day | ORAL | 1 refills | Status: DC | PRN

## 2019-10-01 MED ORDER — KETOCONAZOLE 2 % EX SHAM: 1.0000 "application " | MEDICATED_SHAMPOO | CUTANEOUS | 0 refills | Status: DC

## 2019-10-01 NOTE — Progress Notes (Signed)
Name: Jaime Rice   MRN: [id number]    DOB: 29-Apr-1960   Date:10/01/2019       Progress Note  Chief Complaint  Patient presents with  . Follow-up  . Hyperlipidemia    would like labs, not currently on meds  . Eczema    scalp, hands  . Joint Pain    hip pain left     Subjective:   Jaime Rice is a 59 y.o. female, presents to clinic for routine follow up on the conditions listed above.  Hyperlipidemia: Long history of elevated cholesterol and family hx of same - her cousin who is her age recently had a heart attach and died so she is now worried about treating her cholesterol.  She had labs done last year and has been working on healthier foods and exercising would like to see how her cholesterol is now Last Lipids: Lab Results  Component Value Date   CHOL 250 (H) 07/14/2018   HDL 80 07/14/2018   LDLCALC 151 (H) 07/14/2018   TRIG 88 07/14/2018   CHOLHDL 3.1 07/14/2018  - Current Diet:  Tries to eat less meat - Denies: Chest pain, shortness of breath, myalgias. - Documented aortic atherosclerosis? No - no known - Risk factors for atherosclerosis: hypercholesterolemia  LFTS high last year- did see Dr. Vicente Males RUQ [place] was negative, labs were done  Eczema - to hands cream to hands helps go away when she uses x 2 weeks  Patches to lower scalp neck area-he was told by dermatology to put her same medication on the patches around her scalp  Left hip pain, waking her up at night- hurts all the time, she reports spine pain, shoulder pain and left hip has been worse for the past year.  Left hip pain is located to her hip and outer thigh over greater trochanter area she denies any radiation of pain to her groin she is able to do large circles with her leg even sitting in the exam chair and states that she does not have pain with head movement but walking tends to worsen pain and late at night her pain is much worse especially if rolling over onto that side and will wake her  up.     Patient Active Problem List   Diagnosis Date Noted  . Herniated disc, cervical 01/30/2019  . Gastroesophageal reflux disease without esophagitis 07/14/2018  . History of colon polyps 07/14/2018  . Cervicalgia 07/14/2018  . Other insomnia 07/14/2018  . Elevated LFTs 07/14/2018  . Hyperlipidemia 07/14/2018    History reviewed. No pertinent surgical history.  Family History  Problem Relation Age of Onset  . Uterine cancer Mother 58  . Prostate cancer Father 41    Social History   Tobacco Use  . Smoking status: Never Smoker  . Smokeless tobacco: Never Used  Substance Use Topics  . Alcohol use: Yes    Comment: occasional  . Drug use: Never      Current Outpatient Medications:  .  famotidine (PEPCID) 40 MG tablet, TAKE 1 TABLET BY MOUTH EVERYDAY AT BEDTIME, Disp: 90 tablet, Rfl: 0 .  triamcinolone cream (KENALOG) 0.1 %, Apply 1 application topically 2 (two) times daily., Disp: 30 g, Rfl: 1  No Known Allergies  Chart Review Today: I personally reviewed active problem list, medication list, allergies, family history, social history, health maintenance, notes from last encounter, lab results, imaging with the patient/caregiver today.   Review of Systems  10 Systems reviewed and are negative for  acute change except as noted in the HPI.   Objective:    Vitals:   10/01/19 0938  Pulse: 89  Resp: 14  Temp: 97.9 F (36.6 C)  SpO2: 98%  Weight: 133 lb 11.2 oz (60.6 kg)  Height: 5\' 4"  (1.626 m)    Body mass index is 22.95 kg/m.  Physical Exam Vitals and nursing note reviewed.  Constitutional:      General: She is not in acute distress.    Appearance: Normal appearance. She is well-developed. She is not ill-appearing, toxic-appearing or diaphoretic.     Interventions: Face mask in place.  HENT:     Head: Normocephalic and atraumatic.     Right Ear: External ear normal.     Left Ear: External ear normal.  Eyes:     General: Lids are normal. No  scleral icterus.       Right eye: No discharge.        Left eye: No discharge.     Conjunctiva/sclera: Conjunctivae normal.  Neck:     Trachea: Phonation normal. No tracheal deviation.  Cardiovascular:     Rate and Rhythm: Normal rate and regular rhythm.     Pulses: Normal pulses.          Radial pulses are 2+ on the right side and 2+ on the left side.       Posterior tibial pulses are 2+ on the right side and 2+ on the left side.     Heart sounds: Normal heart sounds. No murmur. No friction rub. No gallop.   Pulmonary:     Effort: Pulmonary effort is normal. No respiratory distress.     Breath sounds: Normal breath sounds. No stridor. No wheezing, rhonchi or rales.  Chest:     Chest wall: No tenderness.  Abdominal:     General: Bowel sounds are normal. There is no distension.     Palpations: Abdomen is soft.     Tenderness: There is no abdominal tenderness. There is no guarding or rebound.  Musculoskeletal:        General: No deformity. Normal range of motion.     Cervical back: Normal range of motion and neck supple.     Left hip: Normal. No tenderness, bony tenderness or crepitus. Normal range of motion. Normal strength.     Right lower leg: No swelling. No edema.     Left lower leg: No swelling. No edema.  Lymphadenopathy:     Cervical: No cervical adenopathy.  Skin:    General: Skin is warm and dry.     Capillary Refill: Capillary refill takes less than 2 seconds.     Coloration: Skin is not jaundiced or pale.     Findings: No rash.  Neurological:     Mental Status: She is alert and oriented to person, place, and time.     Motor: No abnormal muscle tone.     Gait: Gait normal.  Psychiatric:        Speech: Speech normal.        Behavior: Behavior normal.       PHQ2/9: Depression screen [facility] 2/9 10/01/2019 01/30/2019 07/14/2018  Decreased Interest 0 0 0  Down, Depressed, Hopeless 0 0 0  PHQ - 2 Score 0 0 0  Altered sleeping 0 0 0  Tired, decreased energy 0 0 0   Change in appetite 0 0 0  Feeling bad or failure about yourself  0 0 0  Trouble concentrating 0 0 0  Moving  slowly or fidgety/restless 0 0 0  Suicidal thoughts 0 0 0  PHQ-9 Score 0 0 0  Difficult doing work/chores Not difficult at all Not difficult at all Not difficult at all    phq 9 is reviewed and neg  Fall Risk: Fall Risk  10/01/2019 01/30/2019 07/14/2018  Falls in the past year? 0 0 0  Number falls in past yr: 0 0 0  Injury with Fall? 0 0 0  Follow up - Falls evaluation completed -    Functional Status Survey: Is the patient deaf or have difficulty hearing?: No Does the patient have difficulty seeing, even when wearing glasses/contacts?: No Does the patient have difficulty concentrating, remembering, or making decisions?: No Does the patient have difficulty walking or climbing stairs?: No Does the patient have difficulty dressing or bathing?: No Does the patient have difficulty doing errands alone such as visiting a doctor's office or shopping?: No   Assessment & Plan:     ICD-10-CM   1. Hyperlipidemia, unspecified hyperlipidemia type  E78.5 COMPLETE METABOLIC PANEL WITH GFR    Lipid panel   familial, not treated, has been working on diet changes, recheck today, reviewed ASCVD risk, diet lifestyle changes and statin medications given handouts  2. Elevated LFTs  R79.89 COMPLETE METABOLIC PANEL WITH GFR   Has been seen by specialist, will recheck today no pertinent findings that I could see with chart review and lab review, RUQ [place] was negative for fatty liver  3. Flexural eczema  L20.82 triamcinolone cream (KENALOG) 0.1 %   Reviewed eczema care encouraged her to use Vaseline, avoid soaps detergents, manage allergies and can try triamcinolone topical  4. Left hip pain  M25.552 Ambulatory referral to Orthopedics    meloxicam (MOBIC) 7.5 MG tablet   f/up ortho for eval-suspect she may have greater trochanteric bursitis?  Good range of motion of hip, given info about bursitis and  IT band syndrome  5. Rash and nonspecific skin eruption  R21    to scalp - try ketoconazole shampoo      Return for 4 month f/up on cholesterol and LFTS.   Danelle Berry, PA-C  10/01/19 9:56 AM

## 2019-10-01 NOTE — Patient Instructions (Signed)
See cholesterol hand out  Your liver enzymes were elevated - this can be from many different causes, but most common is fatty liver disease - eating a healthy diet, exercising and treating high cholesterol can improve this and help avoid liver damage     Recommendations on cholesterol and starting statins.  There is a benefit from LDL-C (bad cholesterol) lowering with statin therapy at virtually all levels of cardiovascular risk.  If statin therapy had no side effects and caused no financial burden, it might be reasonable to recommend it to virtually all at-risk individuals, similar to a healthy diet and exercise  It is this good of a medication!!  It reduces risk in almost everyone.   There are possible side effects with ALL medications and with statins there is a small subset of the population who may not metabolize it well, which causing muscle aches as a side effect (~5%).  We monitor for this, can test for this, and usually are careful to work with you to get a medication that gives you the benefits with minimal side effects.  Some people are sensitive to medications in general and we try to use the highest dose tolerated to give the most benefit.  [place] [facility] cholesterol and statin guidelines are as follows: In adults 17 to 59 years of age without diabetes mellitus and with LDL-C levels ?59, at a 10-year atherosclerotic cardiovascular disease risk of ?7.5 percent, start a moderate-intensity statin if a discussion of treatment options favors statin therapy  If LDL is >160, statins are indicated.  Patients with other significant risk factors would also benefit from statin.  Some of these factors include a family history of premature cardiovascular disease, chronic kidney disease, or chronic inflammatory disorder (such as chronic human immunodeficiency viral infection).  Can get more information at the following  website:  PromotionalLoans.co.za      For eczema -  Hydrate with vaseline (aquaphor, cetaphil, cerave etc - ointments or heavy creams)  Manage seasonal allergies with over the counter 24 hour antihistamines, and use prescription steroids only for severe raised plaques or rashes  Follow up with your dermatologist if not improving  Atopic Dermatitis Atopic dermatitis is a skin disorder that causes inflammation of the skin. This is the most common type of eczema. Eczema is a group of skin conditions that cause the skin to be itchy, red, and swollen. This condition is generally worse during the cooler winter months and often improves during the warm summer months. Symptoms can vary from person to person. Atopic dermatitis usually starts showing signs in infancy and can last through adulthood. This condition cannot be passed from one person to another (non-contagious), but it is more common in families. Atopic dermatitis may not always be present. When it is present, it is called a flare-up. What are the causes? The exact cause of this condition is not known. Flare-ups of the condition may be triggered by:  Contact with something that you are sensitive or allergic to.  Stress.  Certain foods.  Extremely hot or cold weather.  Harsh chemicals and soaps.  Dry air.  Chlorine. What increases the risk? This condition is more likely to develop in people who have a personal history or family history of eczema, allergies, asthma, or hay fever. What are the signs or symptoms? Symptoms of this condition include:  Dry, scaly skin.  Red, itchy rash.  Itchiness, which can be severe. This may occur before the skin rash. This can  make sleeping difficult.  Skin thickening and cracking that can occur over time. How is this diagnosed? This condition is diagnosed based on your symptoms, a medical history, and a physical exam. How is  this treated? There is no cure for this condition, but symptoms can usually be controlled. Treatment focuses on:  Controlling the itchiness and scratching. You may be given medicines, such as antihistamines or steroid creams.  Limiting exposure to things that you are sensitive or allergic to (allergens).  Recognizing situations that cause stress and developing a plan to manage stress. If your atopic dermatitis does not get better with medicines, or if it is all over your body (widespread), a treatment using a specific type of light (phototherapy) may be used. Follow these instructions at home: Skin care   Keep your skin well-moisturized. Doing this seals in moisture and helps to prevent dryness. ? Use unscented lotions that have petroleum in them. ? Avoid lotions that contain alcohol or water. They can dry the skin.  Keep baths or showers short (less than 5 minutes) in warm water. Do not use hot water. ? Use mild, unscented cleansers for bathing. Avoid soap and bubble bath. ? Apply a moisturizer to your skin right after a bath or shower.  Do not apply anything to your skin without checking with your health care provider. General instructions  Dress in clothes made of cotton or cotton blends. Dress lightly because heat increases itchiness.  When washing your clothes, rinse your clothes twice so all of the soap is removed.  Avoid any triggers that can cause a flare-up.  Try to manage your stress.  Keep your fingernails cut short.  Avoid scratching. Scratching makes the rash and itchiness worse. It may also result in a skin infection (impetigo) due to a break in the skin caused by scratching.  Take or apply over-the-counter and prescription medicines only as told by your health care provider.  Keep all follow-up visits as told by your health care provider. This is important.  Do not be around people who have cold sores or fever blisters. If you get the infection, it may cause  your atopic dermatitis to worsen. Contact a health care provider if:  Your itchiness interferes with sleep.  Your rash gets worse or it is not better within one week of starting treatment.  You have a fever.  You have a rash flare-up after having contact with someone who has cold sores or fever blisters. Get help right away if:  You develop pus or soft yellow scabs in the rash area. Summary  This condition causes a red rash and itchy, dry, scaly skin.  Treatment focuses on controlling the itchiness and scratching, limiting exposure to things that you are sensitive or allergic to (allergens), recognizing situations that cause stress, and developing a plan to manage stress.  Keep your skin well-moisturized.  Keep baths or showers shorter than 5 minutes and use warm water. Do not use hot water. This information is not intended to replace advice given to you by your health care provider. Make sure you discuss any questions you have with your health care provider. Document Revised: 08/01/2018 Document Reviewed: 05/14/2016 Elsevier Patient Education  2020 [employer].

## 2019-10-02 LAB — COMPLETE METABOLIC PANEL WITH GFR
AG Ratio: 1.7 (calc) (ref 1.0–2.5)
ALT: 29 U/L (ref 6–29)
AST: 57 U/L — ABNORMAL HIGH (ref 10–35)
Albumin: 4.6 g/dL (ref 3.6–5.1)
Alkaline phosphatase (APISO): 69 U/L (ref 37–153)
BUN: 17 mg/dL (ref 7–25)
CO2: 28 mmol/L (ref 20–32)
Calcium: 9.9 mg/dL (ref 8.6–10.4)
Chloride: 103 mmol/L (ref 98–110)
Creat: 0.81 mg/dL (ref 0.50–1.05)
GFR, Est African American: 93 mL/min/{1.73_m2} (ref >=60)
GFR, Est Non African American: 80 mL/min/{1.73_m2} (ref >=60)
Globulin: 2.7 g/dL (calc) (ref 1.9–3.7)
Glucose, Bld: 96 mg/dL (ref 65–99)
Potassium: 4.6 mmol/L (ref 3.5–5.3)
Sodium: 140 mmol/L (ref 135–146)
Total Bilirubin: 0.5 mg/dL (ref 0.2–1.2)
Total Protein: 7.3 g/dL (ref 6.1–8.1)

## 2019-10-02 LAB — LIPID PANEL
Cholesterol: 259 mg/dL — ABNORMAL HIGH (ref <200)
HDL: 87 mg/dL (ref >=50)
LDL Cholesterol (Calc): 153 mg/dL (calc) — ABNORMAL HIGH
Non-HDL Cholesterol (Calc): 172 mg/dL (calc) — ABNORMAL HIGH (ref <130)
Total CHOL/HDL Ratio: 3 (calc) (ref <5.0)
Triglycerides: 82 mg/dL (ref <150)

## 2019-10-03 ENCOUNTER — Telehealth: Payer: Self-pay

## 2019-10-03 MED ORDER — ROSUVASTATIN CALCIUM 10 MG PO TABS
10.0000 mg | ORAL_TABLET | Freq: Every day | ORAL | 3 refills | Status: DC
Start: 2019-10-03 — End: 2020-10-06

## 2019-10-03 NOTE — Telephone Encounter (Signed)
-----   Message from Danelle Berry, [place] sent at 10/02/2019  6:20 PM EDT ----- Please call pt with lab results:  Cholesterol is still elevated - actually a little higher than last labs -  Start statin meds and we will monitor and recheck her at her f/up appt  She should continue to work on healthy diet and exercising as able  Avoid fried foods, fatty foods (avoid trans fat and saturated fat), increase whole grain and veggies. Increase or add aerobic exercise.    Please send in crestor 10 mg to take daily at bedtime #90 with 3 refills  LFTs have improved a little but not completely resolved - very mild elevation - I expect it will get all the way better with treating cholesterol The 10-year ASCVD risk score Denman George DC Montez Hageman., et al., 2013) is: 2.3%   Values used to calculate the score:     Age: 59 years     Sex: Female     Is Non-Hispanic African American: No     Diabetic: No     Tobacco smoker: No     Systolic Blood Pressure: 126 mmHg     Is BP treated: No     HDL Cholesterol: 87 mg/dL     Total Cholesterol: 259 mg/dL

## 2019-10-04 ENCOUNTER — Telehealth: Payer: Self-pay

## 2019-10-04 ENCOUNTER — Other Ambulatory Visit: Payer: BC Managed Care – PPO | Attending: Gastroenterology

## 2019-10-04 NOTE — Telephone Encounter (Signed)
Patient has LVM to cancel her colonoscopy with Dr. Tobi Bastos on 10/08/19. She has contacted Trish in Endo to cancel as well.  Procedure has been canceled due to dental health concern.  Thanks,  [place], [place]

## 2019-10-08 ENCOUNTER — Ambulatory Visit
Admission: RE | Admit: 2019-10-08 | Payer: BC Managed Care – PPO | Source: Home / Self Care | Admitting: Gastroenterology

## 2019-10-08 ENCOUNTER — Encounter: Admission: RE | Payer: Self-pay | Source: Home / Self Care

## 2019-10-08 SURGERY — COLONOSCOPY WITH PROPOFOL
Anesthesia: General

## 2019-10-16 DIAGNOSIS — M25511 Pain in right shoulder: Secondary | ICD-10-CM | POA: Diagnosis not present

## 2019-10-16 DIAGNOSIS — M25552 Pain in left hip: Secondary | ICD-10-CM | POA: Diagnosis not present

## 2019-10-23 ENCOUNTER — Other Ambulatory Visit: Payer: Self-pay | Admitting: Family Medicine

## 2019-11-23 DIAGNOSIS — M9902 Segmental and somatic dysfunction of thoracic region: Secondary | ICD-10-CM | POA: Diagnosis not present

## 2019-11-23 DIAGNOSIS — M546 Pain in thoracic spine: Secondary | ICD-10-CM | POA: Diagnosis not present

## 2019-11-23 DIAGNOSIS — M9901 Segmental and somatic dysfunction of cervical region: Secondary | ICD-10-CM | POA: Diagnosis not present

## 2019-11-23 DIAGNOSIS — R519 Headache, unspecified: Secondary | ICD-10-CM | POA: Diagnosis not present

## 2019-12-03 DIAGNOSIS — M9901 Segmental and somatic dysfunction of cervical region: Secondary | ICD-10-CM | POA: Diagnosis not present

## 2019-12-03 DIAGNOSIS — R519 Headache, unspecified: Secondary | ICD-10-CM | POA: Diagnosis not present

## 2019-12-03 DIAGNOSIS — M9902 Segmental and somatic dysfunction of thoracic region: Secondary | ICD-10-CM | POA: Diagnosis not present

## 2019-12-03 DIAGNOSIS — M546 Pain in thoracic spine: Secondary | ICD-10-CM | POA: Diagnosis not present

## 2019-12-06 DIAGNOSIS — M9902 Segmental and somatic dysfunction of thoracic region: Secondary | ICD-10-CM | POA: Diagnosis not present

## 2019-12-06 DIAGNOSIS — M9901 Segmental and somatic dysfunction of cervical region: Secondary | ICD-10-CM | POA: Diagnosis not present

## 2019-12-06 DIAGNOSIS — R519 Headache, unspecified: Secondary | ICD-10-CM | POA: Diagnosis not present

## 2019-12-06 DIAGNOSIS — M546 Pain in thoracic spine: Secondary | ICD-10-CM | POA: Diagnosis not present

## 2019-12-07 DIAGNOSIS — R519 Headache, unspecified: Secondary | ICD-10-CM | POA: Diagnosis not present

## 2019-12-07 DIAGNOSIS — M546 Pain in thoracic spine: Secondary | ICD-10-CM | POA: Diagnosis not present

## 2019-12-07 DIAGNOSIS — M9902 Segmental and somatic dysfunction of thoracic region: Secondary | ICD-10-CM | POA: Diagnosis not present

## 2019-12-07 DIAGNOSIS — M9901 Segmental and somatic dysfunction of cervical region: Secondary | ICD-10-CM | POA: Diagnosis not present

## 2019-12-10 DIAGNOSIS — M546 Pain in thoracic spine: Secondary | ICD-10-CM | POA: Diagnosis not present

## 2019-12-10 DIAGNOSIS — M9901 Segmental and somatic dysfunction of cervical region: Secondary | ICD-10-CM | POA: Diagnosis not present

## 2019-12-10 DIAGNOSIS — R519 Headache, unspecified: Secondary | ICD-10-CM | POA: Diagnosis not present

## 2019-12-10 DIAGNOSIS — M9902 Segmental and somatic dysfunction of thoracic region: Secondary | ICD-10-CM | POA: Diagnosis not present

## 2019-12-19 ENCOUNTER — Telehealth: Payer: Self-pay

## 2019-12-19 NOTE — Telephone Encounter (Signed)
Also front team (Abby, Arline Asp and [place]) have been advised if this patient or spouse calls our office we need to take a message as we should turn this over to [facility]'s legal team and not engage with this patient.

## 2019-12-19 NOTE — Telephone Encounter (Signed)
Thank you for documenting Marchelle Folks.   After discussion, we had planned to waive copay for the 1/13 medical visit given the possibility of confusion even though we did do a medical visit that day as planned (3 week follow-up of hand dermatitis at the same time as botox -- I often will attempt to coordinate both at the same time for patient convenience).    I walked into the office later in this phone conversation with her husband to find him quite angry on the phone and accusing Marchelle Folks and Rhea of lying. We were unable to discuss waiving the copay for the 1/13 medical visit with her husband as he was focused on the 1/28 visit claiming she was not seen.  We reviewed that she was seen that day for an irritated spot on her neck (not in December) and that we have a clinic note, photos, and pathology all from the visit that day. This was in addition to a botox touch-up that day and is documented as a separate medical note for that day.  He claimed that we must have biopsied someone else but did not see his wife. He claimed that we were lying and falsifying medical records and that he was going to his attorney.

## 2019-12-19 NOTE — Telephone Encounter (Signed)
Patient and spouse came into our office on 12/18/19 to discuss office visits and cosmetics charges from 12/20-01/21. Patient was seen in December for a rash and in January for Botox along with office charges. Patient and spouse are arguing and accusing of medical fraud stating the patient was never here in January of 2021 for medical, ONLY for Botox appointments. Dr. Neale Burly was not in the office at the time of in person visit so Bryson Ha and I advised patient and spouse we  would discuss with Dr. Neale Burly and reach out with further information. Confirmed with patient in person it is okay to talk to husband, due to patient having a hard time understanding conversational english.    After speaking with Dr. Neale Burly and Annice Pih (RMA during visits) we found patient did have a biopsy with pathology report, along with a photo on date of service 05/24/2019.   This afternoon spouse, Chapman Moss, returned my call. After introducing myself and stating on 05/24/2019 Mrs. Convery had a biopsy done along with a photo he demanded we were all liars. I placed spouse on hold and grabbed Rhea to have a second pair of ears in my office and Dr. Neale Burly also joined in on the phone call. We tried explaining to the patient that we have photos placed in [employer] which automatically stamps date of service, ALONG with outside company [facility]) having the biopsy showing the patient was in our office on date of service.   Mr. Valetta Mole states "he will spend every last penny to turn this over to his attorney as we are creating false reports/office visits". He also demands we removed mole from another patient and the date of service attached to the photo was changed. We were unable to advised spouse that we were waiving $50 charge on 05/09/19 as he started yelling regarding involving an attorney and stating he was done with this issue to hang up the phone.

## 2020-01-29 LAB — HM COLONOSCOPY

## 2020-02-04 ENCOUNTER — Encounter: Payer: Self-pay | Admitting: Dermatology

## 2020-02-04 NOTE — Progress Notes (Signed)
Name: Jaime Rice   MRN: [id number]    DOB: [birth year]   Date:02/05/2020       Progress Note  Chief Complaint  Patient presents with  . Hyperlipidemia     Subjective:   Jaime Rice is a 59 y.o. female, presents to clinic for f/up on HLD, started meds, and elevated LFT f/up  Patient brings in a lot of paperwork from recently visiting her home in [place], while she was there she had colonoscopy, mammogram and lab work done she was there from August to October returned to the [place] about 1 week ago.  Hyperlipidemia: Currently treated with Crestor 10mg  qd Crestor was a new medication prescribed to her in June because of her concerns of her family history, her elevated cholesterol that did not improve with strict diet and lifestyle efforts and due to a cardiac event with her cousin who is her same age.  Her ASCVD risk score was reviewed at that time and though it was low around 2% the patient did want to start a cholesterol medication She states that she did not start the Crestor for several months, she started around the beginning of September and has only taken it for about 1 month.  Her labs were done and [place] about 1 week ago on October 4.  She states at that time her cholesterol was excellent so she stopped the medications due to concerns of elevated liver function tests and changes in her renal function. Her labs and all records are in 07[birth year]06-1969  Last Lipids: Lab Results  Component Value Date   CHOL 259 (H) 10/01/2019   CHOL 250 (H) 07/14/2018   Lab Results  Component Value Date   HDL 87 10/01/2019   HDL 80 07/14/2018   Lab Results  Component Value Date   LDLCALC 153 (H) 10/01/2019   LDLCALC 151 (H) 07/14/2018   Lab Results  Component Value Date   TRIG 82 10/01/2019   TRIG 88 07/14/2018   Lab Results  Component Value Date   CHOLHDL 3.0 10/01/2019   CHOLHDL 3.1 07/14/2018  The 10-year ASCVD risk score 07/16/2018 DC Jr., et al., 2013) is: 2%   Values used to  calculate the score:     Age: 27 years     Sex: Female     Is Non-Hispanic African American: No     Diabetic: No     Tobacco smoker: No     Systolic Blood Pressure: 118 mmHg     Is BP treated: No     HDL Cholesterol: 87 mg/dL     Total Cholesterol: 259 mg/dL Family hx of cardiac disease (cousin her age with MI) and family hx of HLD - Denies: Chest pain, shortness of breath, myalgias, claudication While taking crestor 10 mg she did not have any sx or SE   Elevated LFTs since 11/2018, have been improving Lab Results  Component Value Date   ALT 29 10/01/2019   AST 57 (H) 10/01/2019   ALKPHOS 80 12/12/2018   BILITOT 0.5 10/01/2019  Labs 11/2018 AST/ALT 66 and 38, improving  Labs in 12/2018 show AST increased to 60's, ALT normal - she reports drinking much more when in [place]  Pt continues to work on healthy diet/lifestyle  Mammogram done in [place] 12/25/2019 12/27/2019 - she has disc of negative mammogram and they advised 2 year f/up mammogram Colonoscopy - she reports a few polyps  GERD: worse with ETOH, she has been using omeprazole when her symptoms increase and previously  was using famotidine 20 mg once daily and this was not as effective.  She was given a different medication by her doctor which is 3 times a day and is not helping her symptoms very much.Ftopride supremex 50 mg TID?  Eczema flared up on hands Her scalp rash improved with only 2 uses of ketoconazole shampoo   Current Outpatient Medications:  .  ketoconazole (NIZORAL) 2 % shampoo, Apply 1 application topically 2 (two) times a week., Disp: 120 mL, Rfl: 0 .  triamcinolone cream (KENALOG) 0.1 %, Apply 1 application topically 2 (two) times daily., Disp: 30 g, Rfl: 1 .  famotidine (PEPCID) 40 MG tablet, TAKE 1 TABLET BY MOUTH EVERYDAY AT BEDTIME, Disp: 90 tablet, Rfl: 0 .  meloxicam (MOBIC) 7.5 MG tablet, Take 1 tablet (7.5 mg total) by mouth daily as needed for pain., Disp: 30 tablet, Rfl: 1 .  rosuvastatin (CRESTOR)  10 MG tablet, Take 1 tablet (10 mg total) by mouth daily. (Patient not taking: Reported on 02/05/2020), Disp: 90 tablet, Rfl: 3  Patient Active Problem List   Diagnosis Date Noted  . Herniated disc, cervical 01/30/2019  . Gastroesophageal reflux disease without esophagitis 07/14/2018  . History of colon polyps 07/14/2018  . Cervicalgia 07/14/2018  . Other insomnia 07/14/2018  . Elevated LFTs 07/14/2018  . Hyperlipidemia 07/14/2018    No past surgical history on file.  Family History  Problem Relation Age of Onset  . Uterine cancer Mother 50  . Prostate cancer Father 84    Social History   Tobacco Use  . Smoking status: Never Smoker  . Smokeless tobacco: Never Used  Vaping Use  . Vaping Use: Never used  Substance Use Topics  . Alcohol use: Yes    Comment: occasional  . Drug use: Never     No Known Allergies  Health Maintenance  Topic Date Due  . COLONOSCOPY  Never done  . INFLUENZA VACCINE  11/25/2019  . MAMMOGRAM  12/20/2020  . PAP SMEAR-Modifier  01/29/2022  . TETANUS/TDAP  05/02/2028  . COVID-19 Vaccine  Completed  . Hepatitis C Screening  Completed  . HIV Screening  Completed    Chart Review Today: I personally reviewed active problem list, medication list, allergies, family history, social history, health maintenance, notes from last encounter, lab results, imaging with the patient/caregiver today.   Review of Systems  10 Systems reviewed and are negative for acute change except as noted in the HPI.  Objective:   Vitals:   02/05/20 0940  BP: 118/74  Pulse: 72  Resp: 16  Temp: 98 F (36.7 C)  TempSrc: Oral  SpO2: 100%  Weight: 134 lb (60.8 kg)  Height: 5\' 4"  (1.626 m)    Body mass index is 23 kg/m.  Physical Exam Vitals and nursing note reviewed.  Constitutional:      Appearance: She is well-developed.  HENT:     Head: Normocephalic and atraumatic.  Eyes:     General:        Right eye: No discharge.        Left eye: No discharge.      Conjunctiva/sclera: Conjunctivae normal.  Neck:     Trachea: No tracheal deviation.  Cardiovascular:     Rate and Rhythm: Normal rate and regular rhythm.  Pulmonary:     Effort: Pulmonary effort is normal. No respiratory distress.     Breath sounds: No stridor.  Musculoskeletal:        General: Normal range of motion.  Skin:  General: Skin is warm and dry.     Coloration: Skin is not jaundiced or pale.     Findings: No rash.  Neurological:     Mental Status: She is alert. Mental status is at baseline.     Motor: No abnormal muscle tone.     Coordination: Coordination normal.  Psychiatric:        Mood and Affect: Mood normal.        Behavior: Behavior normal.         Assessment & Plan:     ICD-10-CM   1. Hyperlipidemia, unspecified hyperlipidemia type  E78.5 COMPLETE METABOLIC PANEL WITH GFR    Lipid panel   took crestor for 1 month, encouraged her to continue to take and recheck after 3-4 months of use - [place] labs were better aftere 1 month of use  2. Elevated LFTs  R79.89 COMPLETE METABOLIC PANEL WITH GFR   improving mild LFT elevation from 11/2018, slight increase while in [place], but more ETOH, advised pt to continue healthy diet, avoid ETOH, continue statin  3. Gastroesophageal reflux disease, unspecified whether esophagitis present  K21.9    omeprazole 14 d course OK with worsening sx, encouraged pt to wean off with famotadine 20 mg BID PRN   4. Eczema, unspecified type  L30.9    suspect dyshidrotic eczema to hands encouraged her to focus on hydration, use triamcinolone ointment, flaring normal for worse symptoms cold weather  5. Encounter for medication monitoring  Z51.81 COMPLETE METABOLIC PANEL WITH GFR    Lipid panel   Patient only took 1 month of medications we will have her return in 2 to 3 months to do her lab work   I reviewed her records as best I could today I reviewed and explained how her liver function tests over time may improve if she is able  to manage the cholesterol with medications and she should continue to work on healthy diet and avoid processed foods fast food fried or fatty foods She is very concerned with her renal function although it did appear that her GFR was normal is greater than 60 and explained how her cholesterol medication would not affect this  We will attempt to scan in an abstract her records although the there are another language -will update health maintenance tab is able  She does endorse wanting to follow some of the recommendations of her [place] physicians such as breast cancer screening every 2 years was advised by them, stopping cholesterol medication was advised but I did try to explain to the patient that she saw an improvement in her cholesterol or her cholesterol labs were normal only because she was on the medication for the past year she has worked on diet and lifestyle efforts and her cholesterol did not improve at all and LDL was over 150.  I try to explain that the Crestor medication will improve it while she is taking it but it will likely return to abnormal and elevated when she stops it. I reassured her that we would be monitoring her liver function tests and I would be concerned if she has side effects or symptoms when taking Crestor or if her liver enzymes go over 3 times the upper level of normal.  Return in about 6 months (around 08/05/2020) for Routine follow-up.  Labs in 2-3 months  Danelle Berry, PA-C 02/05/20 9:43 AM

## 2020-02-05 ENCOUNTER — Encounter: Payer: Self-pay | Admitting: Family Medicine

## 2020-02-05 ENCOUNTER — Other Ambulatory Visit: Payer: Self-pay

## 2020-02-05 ENCOUNTER — Ambulatory Visit: Payer: BC Managed Care – PPO | Admitting: Family Medicine

## 2020-02-05 VITALS — BP 118/74 | HR 72 | Temp 98.0°F | Resp 16 | Ht 64.0 in | Wt 134.0 lb

## 2020-02-05 DIAGNOSIS — Z23 Encounter for immunization: Secondary | ICD-10-CM

## 2020-02-05 DIAGNOSIS — K219 Gastro-esophageal reflux disease without esophagitis: Secondary | ICD-10-CM | POA: Diagnosis not present

## 2020-02-05 DIAGNOSIS — Z5181 Encounter for therapeutic drug level monitoring: Secondary | ICD-10-CM

## 2020-02-05 DIAGNOSIS — R7989 Other specified abnormal findings of blood chemistry: Secondary | ICD-10-CM | POA: Diagnosis not present

## 2020-02-05 DIAGNOSIS — E785 Hyperlipidemia, unspecified: Secondary | ICD-10-CM | POA: Diagnosis not present

## 2020-02-05 DIAGNOSIS — L309 Dermatitis, unspecified: Secondary | ICD-10-CM

## 2020-02-05 NOTE — Patient Instructions (Addendum)
Come back to get labs rechecked in 2-3 months for cholesterol and rechecking your liver enzymes  I recommend continuing to take crestor 10 mg until we can recheck after 3-4 months of use, and then see how we can adjust the dose.

## 2020-02-06 ENCOUNTER — Encounter: Payer: Self-pay | Admitting: Family Medicine

## 2020-03-14 ENCOUNTER — Other Ambulatory Visit: Payer: Self-pay | Admitting: Gastroenterology

## 2020-03-14 ENCOUNTER — Other Ambulatory Visit: Payer: Self-pay | Admitting: Family Medicine

## 2020-03-21 DIAGNOSIS — Z03818 Encounter for observation for suspected exposure to other biological agents ruled out: Secondary | ICD-10-CM | POA: Diagnosis not present

## 2020-03-21 DIAGNOSIS — Z1152 Encounter for screening for COVID-19: Secondary | ICD-10-CM | POA: Diagnosis not present

## 2020-04-07 ENCOUNTER — Other Ambulatory Visit: Payer: Self-pay

## 2020-04-07 ENCOUNTER — Encounter: Payer: Self-pay | Admitting: Family Medicine

## 2020-04-07 ENCOUNTER — Ambulatory Visit: Payer: BC Managed Care – PPO | Admitting: Family Medicine

## 2020-04-07 VITALS — BP 112/74 | HR 84 | Temp 97.8°F | Resp 16 | Ht 64.0 in | Wt 133.0 lb

## 2020-04-07 DIAGNOSIS — Z5181 Encounter for therapeutic drug level monitoring: Secondary | ICD-10-CM | POA: Diagnosis not present

## 2020-04-07 DIAGNOSIS — E785 Hyperlipidemia, unspecified: Secondary | ICD-10-CM

## 2020-04-07 DIAGNOSIS — R87810 Cervical high risk human papillomavirus (HPV) DNA test positive: Secondary | ICD-10-CM

## 2020-04-07 DIAGNOSIS — L2082 Flexural eczema: Secondary | ICD-10-CM

## 2020-04-07 DIAGNOSIS — K219 Gastro-esophageal reflux disease without esophagitis: Secondary | ICD-10-CM | POA: Diagnosis not present

## 2020-04-07 DIAGNOSIS — R21 Rash and other nonspecific skin eruption: Secondary | ICD-10-CM

## 2020-04-07 DIAGNOSIS — R87610 Atypical squamous cells of undetermined significance on cytologic smear of cervix (ASC-US): Secondary | ICD-10-CM

## 2020-04-07 DIAGNOSIS — R7989 Other specified abnormal findings of blood chemistry: Secondary | ICD-10-CM

## 2020-04-07 DIAGNOSIS — L309 Dermatitis, unspecified: Secondary | ICD-10-CM

## 2020-04-07 MED ORDER — KETOCONAZOLE 2 % EX SHAM: 1.0000 "application " | MEDICATED_SHAMPOO | CUTANEOUS | 3 refills | Status: DC

## 2020-04-07 MED ORDER — FAMOTIDINE 40 MG PO TABS
40.0000 mg | ORAL_TABLET | Freq: Every day | ORAL | 3 refills | Status: DC | PRN
Start: 2020-04-07 — End: 2021-08-14

## 2020-04-07 MED ORDER — TRIAMCINOLONE ACETONIDE 0.1 % EX CREA: 1.0000 "application " | TOPICAL_CREAM | Freq: Two times a day (BID) | CUTANEOUS | 2 refills | Status: DC | PRN

## 2020-04-07 MED ORDER — OMEPRAZOLE 20 MG PO CPDR: 20.0000 mg | DELAYED_RELEASE_CAPSULE | Freq: Every day | ORAL | 3 refills | Status: DC | PRN

## 2020-04-07 NOTE — Progress Notes (Signed)
Name: Jaime Rice   MRN: [id number]    DOB: 31-Dec-1960   Date:04/07/2020       Progress Note  Chief Complaint  Patient presents with  . Hyperlipidemia    2 month follow up     Subjective:   Jaime Rice is a 59 y.o. female, presents to clinic for f/up on LFTs and cholesterol  Pt had recently started Crestor 10 mg this past summer, she went out of the country for several months and return with labs and medical documents from while she was in [place].  There she did a lot of her screening and blood work and she was advised to stop her Crestor because of increased LFTs.  I did attempt to interpret the labs best I could with the language difference, patient had been on medications for about 4 months it did show a improvement in her lipids did not see a significant difference in her LFTs she is here to recheck today.  Hyperlipidemia: Currently treated with crestor 10 mg, pt reports good med compliance x 6 months  Last Lipids: Lab Results  Component Value Date   CHOL 259 (H) 10/01/2019   HDL 87 10/01/2019   LDLCALC 153 (H) 10/01/2019   TRIG 82 10/01/2019   CHOLHDL 3.0 10/01/2019   - Denies: Chest pain, shortness of breath, myalgias, claudication  GERD- managed with pepcid - she wants higher dose once daily since 20 mg right now does not help manage her sx as well as 40 mg once daily When she drinks alcohol she uses omeprazole - she requests refill of this as well.  Eczema - more itching spreading to arms and trunk - only trying triamcinolone ointment to where she had rash on hands.  Needs refill on ketoconazole shampoo- 3 refills were sent in last month - but she hasn't gotten them from the pharmacy she doesn't know why   Current Outpatient Medications:  .  famotidine (PEPCID) 20 MG tablet, Take 20 mg by mouth 2 (two) times daily., Disp: , Rfl:  .  ketoconazole (NIZORAL) 2 % shampoo, APPLY 1 APPLICATION TOPICALLY 2 (TWO) TIMES A WEEK., Disp: 120 mL, Rfl: 2 .  rosuvastatin  (CRESTOR) 10 MG tablet, Take 1 tablet (10 mg total) by mouth daily., Disp: 90 tablet, Rfl: 3 .  triamcinolone cream (KENALOG) 0.1 %, Apply 1 application topically 2 (two) times daily., Disp: 30 g, Rfl: 1 .  meloxicam (MOBIC) 7.5 MG tablet, Take 1 tablet (7.5 mg total) by mouth daily as needed for pain., Disp: 30 tablet, Rfl: 1  Patient Active Problem List   Diagnosis Date Noted  . Eczema 02/05/2020  . Herniated disc, cervical 01/30/2019  . Gastroesophageal reflux disease 07/14/2018  . History of colon polyps 07/14/2018  . Cervicalgia 07/14/2018  . Other insomnia 07/14/2018  . Elevated LFTs 07/14/2018  . Hyperlipidemia 07/14/2018    No past surgical history on file.  Family History  Problem Relation Age of Onset  . Uterine cancer Mother 54  . Prostate cancer Father 74    Social History   Tobacco Use  . Smoking status: Never Smoker  . Smokeless tobacco: Never Used  Vaping Use  . Vaping Use: Never used  Substance Use Topics  . Alcohol use: Yes    Comment: occasional  . Drug use: Never     No Known Allergies  Health Maintenance  Topic Date Due  . COLONOSCOPY  Never done  . COVID-19 Vaccine (3 - Booster for Pfizer series) 02/27/2020  .  MAMMOGRAM  12/20/2020  . PAP SMEAR-Modifier  01/29/2022  . TETANUS/TDAP  05/02/2028  . INFLUENZA VACCINE  Completed  . Hepatitis C Screening  Completed  . HIV Screening  Completed    Chart Review Today: I personally reviewed active problem list, medication list, allergies, family history, social history, health maintenance, notes from last encounter, lab results, imaging with the patient/caregiver today.   Review of Systems  10 Systems reviewed and are negative for acute change except as noted in the HPI.  Objective:   Vitals:   04/07/20 0930  BP: 112/74  Pulse: 84  Resp: 16  Temp: 97.8 F (36.6 C)  TempSrc: Oral  SpO2: 99%  Weight: 133 lb (60.3 kg)  Height: 5\' 4"  (1.626 m)    Body mass index is 22.83  kg/m.  Physical Exam Nursing note reviewed.  Constitutional:      General: She is not in acute distress.    Appearance: Normal appearance. She is normal weight. She is not ill-appearing, toxic-appearing or diaphoretic.  HENT:     Right Ear: External ear normal.     Left Ear: External ear normal.  Eyes:     General: No scleral icterus.       Right eye: No discharge.  Cardiovascular:     Rate and Rhythm: Normal rate and regular rhythm.     Pulses: Normal pulses.     Heart sounds: Normal heart sounds.  Pulmonary:     Effort: Pulmonary effort is normal.     Breath sounds: Normal breath sounds.  Abdominal:     General: Bowel sounds are normal.  Musculoskeletal:        General: No tenderness.     Right lower leg: No edema.     Left lower leg: No edema.  Skin:    Findings: Rash present.  Neurological:     Mental Status: She is alert. Mental status is at baseline.     Gait: Gait abnormal.  Psychiatric:        Mood and Affect: Mood normal.        Behavior: Behavior normal.         Assessment & Plan:   1. Flexural eczema Encouraged pt to focus on hydration, can use cortisone 10 OTC, refill on kenalog ointment - can use to areas of rash -avoiding face - triamcinolone (KENALOG) 0.1 %; Apply 1 application topically 2 (two) times daily as needed. To affected skin (dry, itchy with rash - avoid applying to face or genitals)  Dispense: 30 g; Refill: 2  2. Hyperlipidemia, unspecified hyperlipidemia type Compliant with crestor 10 mg for 6 months She was concerned with LFTs - labs from - recheck today She is tolerating, no SE She wants to lower dose to 5 mg daily if she can  3. Elevated LFTs Recheck labs  4. Gastroesophageal reflux disease, unspecified whether esophagitis present Encouraged PPI use for 14 d at a time when needed - famotidine (PEPCID) 40 MG tablet; Take 1 tablet (40 mg total) by mouth daily as needed for heartburn or indigestion.  Dispense: 90 tablet;  Refill: 3 - omeprazole (PRILOSEC) 20 MG capsule; Take 1 capsule (20 mg total) by mouth daily as needed (recommend using for 14 d treatment courses at a time when needed).  Dispense: 30 capsule; Refill: 3  5. Eczema, unspecified type See above - triamcinolone (KENALOG) 0.1 %; Apply 1 application topically 2 (two) times daily as needed. To affected skin (dry, itchy with rash - avoid applying  to face or genitals)  Dispense: 30 g; Refill: 2  6. Encounter for medication monitoring Labs done today - ordered previously CMP and lipid panel  7. ASCUS with positive high risk HPV cervical ASCUS with + HPV, went to Dr. Bonney Aid for f/up last year, was due for 1 year recheck as of Nov 2021 - encouraged pt to call [facility] to schedule or can schedule physical here with PAP  8. Rash and nonspecific skin eruption Refills sent in again - ketoconazole (NIZORAL) 2 % shampoo; Apply 1 application topically 2 (two) times a week. Apply to scalp for seborrheic dermatitis  Dispense: 120 mL; Refill: 3   Return in about 6 months (around 10/06/2020) for Routine follow-up.   Danelle Berry, PA-C 04/07/20 9:38 AM

## 2020-04-07 NOTE — Patient Instructions (Signed)
Team Member Role and Specialty Contact Info Comments  Vena Austria, MD Referring Physician (Obstetrics and Gynecology) Phone: [phone] Fax: [phone]     Call Dr. Alvia Grove office to do your one year follow-up  If they cannot get you in soon then please call us and we can schedule you for a physical and PAP.

## 2020-04-08 LAB — COMPLETE METABOLIC PANEL WITH GFR
AG Ratio: 1.7 (calc) (ref 1.0–2.5)
ALT: 30 U/L — ABNORMAL HIGH (ref 6–29)
AST: 55 U/L — ABNORMAL HIGH (ref 10–35)
Albumin: 4.3 g/dL (ref 3.6–5.1)
Alkaline phosphatase (APISO): 66 U/L (ref 37–153)
BUN: 13 mg/dL (ref 7–25)
CO2: 28 mmol/L (ref 20–32)
Calcium: 9.9 mg/dL (ref 8.6–10.4)
Chloride: 104 mmol/L (ref 98–110)
Creat: 0.78 mg/dL (ref 0.50–1.05)
GFR, Est African American: 96 mL/min/{1.73_m2} (ref >=60)
GFR, Est Non African American: 83 mL/min/{1.73_m2} (ref >=60)
Globulin: 2.6 g/dL (calc) (ref 1.9–3.7)
Glucose, Bld: 100 mg/dL — ABNORMAL HIGH (ref 65–99)
Potassium: 4.4 mmol/L (ref 3.5–5.3)
Sodium: 139 mmol/L (ref 135–146)
Total Bilirubin: 0.5 mg/dL (ref 0.2–1.2)
Total Protein: 6.9 g/dL (ref 6.1–8.1)

## 2020-04-08 LAB — LIPID PANEL
Cholesterol: 184 mg/dL (ref <200)
HDL: 87 mg/dL (ref >=50)
LDL Cholesterol (Calc): 81 mg/dL (calc)
Non-HDL Cholesterol (Calc): 97 mg/dL (calc) (ref <130)
Total CHOL/HDL Ratio: 2.1 (calc) (ref <5.0)
Triglycerides: 75 mg/dL (ref <150)

## 2020-04-09 ENCOUNTER — Encounter: Payer: Self-pay | Admitting: Family Medicine

## 2020-04-30 DIAGNOSIS — S0990XA Unspecified injury of head, initial encounter: Secondary | ICD-10-CM | POA: Diagnosis not present

## 2020-04-30 DIAGNOSIS — S82141A Displaced bicondylar fracture of right tibia, initial encounter for closed fracture: Secondary | ICD-10-CM | POA: Diagnosis not present

## 2020-04-30 DIAGNOSIS — G8911 Acute pain due to trauma: Secondary | ICD-10-CM | POA: Diagnosis not present

## 2020-04-30 DIAGNOSIS — M25561 Pain in right knee: Secondary | ICD-10-CM | POA: Diagnosis not present

## 2020-04-30 DIAGNOSIS — M546 Pain in thoracic spine: Secondary | ICD-10-CM | POA: Diagnosis not present

## 2020-04-30 DIAGNOSIS — M542 Cervicalgia: Secondary | ICD-10-CM | POA: Diagnosis not present

## 2020-04-30 DIAGNOSIS — S069X1A Unspecified intracranial injury with loss of consciousness of 30 minutes or less, initial encounter: Secondary | ICD-10-CM | POA: Diagnosis not present

## 2020-04-30 DIAGNOSIS — S0083XA Contusion of other part of head, initial encounter: Secondary | ICD-10-CM | POA: Diagnosis not present

## 2020-04-30 DIAGNOSIS — M47812 Spondylosis without myelopathy or radiculopathy, cervical region: Secondary | ICD-10-CM | POA: Diagnosis not present

## 2020-04-30 DIAGNOSIS — R030 Elevated blood-pressure reading, without diagnosis of hypertension: Secondary | ICD-10-CM | POA: Diagnosis not present

## 2020-05-19 DIAGNOSIS — L2089 Other atopic dermatitis: Secondary | ICD-10-CM | POA: Diagnosis not present

## 2020-05-19 DIAGNOSIS — L209 Atopic dermatitis, unspecified: Secondary | ICD-10-CM | POA: Diagnosis not present

## 2020-05-19 DIAGNOSIS — B86 Scabies: Secondary | ICD-10-CM | POA: Diagnosis not present

## 2020-06-09 DIAGNOSIS — L2089 Other atopic dermatitis: Secondary | ICD-10-CM | POA: Diagnosis not present

## 2020-06-19 DIAGNOSIS — S82101D Unspecified fracture of upper end of right tibia, subsequent encounter for closed fracture with routine healing: Secondary | ICD-10-CM | POA: Diagnosis not present

## 2020-06-25 NOTE — Progress Notes (Signed)
Name: Jaime Rice   MRN: [id number]    DOB: [birth year]   Date:06/26/2020       Progress Note  Subjective  Chief Complaint  Itching  HPI  She has seen Dermatologist for generalized pruritis and rash, she had a history of Eczema but much worse end of December.  She saw a different dermatologist Dr. Phillip Heal  and was given oral medication and also topical medication. She states symptoms have improved but  was advised to have labs in our office and recheck pap smear because of history of HPV and it could potentially be the cause of her pruritis. No change in appetite, no weight loss. Denies recent travel.    Hyperglycemia: she denies polyphagia, polydipsia or polyuria. Glucose has been elevated previous labs we will check A1C  Stress: she was born in [place] up to age 82 , went to [place] in [place] and has people she knows in both countries. She states she has not been sleeping well. She is worried about family members and relatives.   Patient Active Problem List   Diagnosis Date Noted  . Eczema 02/05/2020  . Herniated disc, cervical 01/30/2019  . Gastroesophageal reflux disease 07/14/2018  . History of colon polyps 07/14/2018  . Cervicalgia 07/14/2018  . Other insomnia 07/14/2018  . Elevated LFTs 07/14/2018  . Hyperlipidemia 07/14/2018    No past surgical history on file.  Family History  Problem Relation Age of Onset  . Uterine cancer Mother 9  . Prostate cancer Father 61    Social History   Tobacco Use  . Smoking status: Never Smoker  . Smokeless tobacco: Never Used  Substance Use Topics  . Alcohol use: Yes    Comment: occasional     Current Outpatient Medications:  .  famotidine (PEPCID) 40 MG tablet, Take 1 tablet (40 mg total) by mouth daily as needed for heartburn or indigestion., Disp: 90 tablet, Rfl: 3 .  hydrOXYzine (ATARAX/VISTARIL) 25 MG tablet, Take 25 mg by mouth 2 (two) times daily., Disp: , Rfl:  .  ketoconazole (NIZORAL) 2 % shampoo, Apply 1  application topically 2 (two) times a week. Apply to scalp for seborrheic dermatitis, Disp: 120 mL, Rfl: 3 .  mometasone (ELOCON) 0.1 % ointment, Apply 1 application topically 2 (two) times daily., Disp: , Rfl:  .  omeprazole (PRILOSEC) 20 MG capsule, Take 1 capsule (20 mg total) by mouth daily as needed (recommend using for 14 d treatment courses at a time when needed)., Disp: 30 capsule, Rfl: 3 .  rosuvastatin (CRESTOR) 10 MG tablet, Take 1 tablet (10 mg total) by mouth daily., Disp: 90 tablet, Rfl: 3 .  triamcinolone (KENALOG) 0.025 % cream, Apply topically., Disp: , Rfl:   No Known Allergies  I personally reviewed active problem list, medication list, allergies, family history, social history with the patient/caregiver today.   ROS  Ten systems reviewed and is negative except as mentioned in HPI   Objective  Vitals:   06/26/20 0829  BP: 110/70  Pulse: 93  Resp: 16  Temp: 97.8 F (36.6 C)  SpO2: 98%  Weight: 129 lb 4.8 oz (58.7 kg)  Height: $Remove'5\' 4"'AxvPiyi$  (1.626 m)    Body mass index is 22.19 kg/m.  Physical Exam  Constitutional: Patient appears well-developed and well-nourished. No distress.  HEENT: head atraumatic, normocephalic, pupils equal and reactive to light, neck supple Cardiovascular: Normal rate, regular rhythm and normal heart sounds.  No murmur heard. No BLE edema. Pulmonary/Chest: Effort normal and breath sounds normal.  No respiratory distress. Abdominal: Soft.  There is no tenderness. Pelvic: normal exam, pap smear collected  Psychiatric: Patient has a normal mood and affect. behavior is normal. Judgment and thought content normal.  Recent Results (from the past 2160 hour(s))  COMPLETE METABOLIC PANEL WITH GFR     Status: Abnormal   Collection Time: 04/07/20  9:59 AM  Result Value Ref Range   Glucose, Bld 100 (H) 65 - 99 mg/dL    Comment: .            Fasting reference interval . For someone without known diabetes, a glucose value between 100 and 125 mg/dL  is consistent with prediabetes and should be confirmed with a follow-up test. .    BUN 13 7 - 25 mg/dL   Creat 0.78 0.50 - 1.05 mg/dL    Comment: For patients >20 years of age, the reference limit for Creatinine is approximately 13% higher for people identified as African-American. .    GFR, Est Non African American 83 > OR = 60 mL/min/1.75m2   GFR, Est African American 96 > OR = 60 mL/min/1.66m2   BUN/Creatinine Ratio NOT APPLICABLE 6 - 22 (calc)   Sodium 139 135 - 146 mmol/L   Potassium 4.4 3.5 - 5.3 mmol/L   Chloride 104 98 - 110 mmol/L   CO2 28 20 - 32 mmol/L   Calcium 9.9 8.6 - 10.4 mg/dL   Total Protein 6.9 6.1 - 8.1 g/dL   Albumin 4.3 3.6 - 5.1 g/dL   Globulin 2.6 1.9 - 3.7 g/dL (calc)   AG Ratio 1.7 1.0 - 2.5 (calc)   Total Bilirubin 0.5 0.2 - 1.2 mg/dL   Alkaline phosphatase (APISO) 66 37 - 153 U/L   AST 55 (H) 10 - 35 U/L   ALT 30 (H) 6 - 29 U/L  Lipid panel     Status: None   Collection Time: 04/07/20  9:59 AM  Result Value Ref Range   Cholesterol 184 <200 mg/dL   HDL 87 > OR = 50 mg/dL   Triglycerides 75 <150 mg/dL   LDL Cholesterol (Calc) 81 mg/dL (calc)    Comment: Reference range: <100 . Desirable range <100 mg/dL for primary prevention;   <70 mg/dL for patients with CHD or diabetic patients  with > or = 2 CHD risk factors. Marland Kitchen LDL-C is now calculated using the Martin-Hopkins  calculation, which is a validated novel method providing  better accuracy than the Friedewald equation in the  estimation of LDL-C.  Cresenciano Genre et al. Annamaria Helling. 3151;761(60): 2061-2068  (http://education.[facility].com/faq/FAQ164)    Total CHOL/HDL Ratio 2.1 <5.0 (calc)   Non-HDL Cholesterol (Calc) 97 <130 mg/dL (calc)    Comment: For patients with diabetes plus 1 major ASCVD risk  factor, treating to a non-HDL-C goal of <100 mg/dL  (LDL-C of <70 mg/dL) is considered a therapeutic  option.       PHQ2/9: Depression screen [facility] 2/9 06/26/2020 04/07/2020 02/05/2020 10/01/2019  01/30/2019  Decreased Interest 0 0 0 0 0  Down, Depressed, Hopeless 0 0 0 0 0  PHQ - 2 Score 0 0 0 0 0  Altered sleeping - - - 0 0  Tired, decreased energy - - - 0 0  Change in appetite - - - 0 0  Feeling bad or failure about yourself  - - - 0 0  Trouble concentrating - - - 0 0  Moving slowly or fidgety/restless - - - 0 0  Suicidal thoughts - - - 0 0  PHQ-9 Score - - - 0 0  Difficult doing work/chores Not difficult at all - - Not difficult at all Not difficult at all    phq 9 is negative   Fall Risk: Fall Risk  06/26/2020 04/07/2020 02/05/2020 10/01/2019 01/30/2019  Falls in the past year? 0 0 0 0 0  Number falls in past yr: 0 0 0 0 0  Injury with Fall? 0 0 0 0 0  Follow up - Falls evaluation completed Falls evaluation completed - Falls evaluation completed     Functional Status Survey: Is the patient deaf or have difficulty hearing?: No Does the patient have difficulty seeing, even when wearing glasses/contacts?: No Does the patient have difficulty concentrating, remembering, or making decisions?: No Does the patient have difficulty walking or climbing stairs?: No Does the patient have difficulty dressing or bathing?: No Does the patient have difficulty doing errands alone such as visiting a doctor's office or shopping?: No   Assessment & Plan  1. ASCUS with positive high risk HPV cervical  - Cytology - PAP  2. Pruritus  - C-reactive protein - Sed Rate (ESR) - COMPLETE METABOLIC PANEL WITH GFR - CBC with Differential/Platelet - TSH - ANA,IFA RA Diag Pnl w/rflx Tit/Patn - RPR  3. Rash  - C-reactive protein - Sed Rate (ESR) - COMPLETE METABOLIC PANEL WITH GFR - CBC with Differential/Platelet - TSH - ANA,IFA RA Diag Pnl w/rflx Tit/Patn - RPR  4. Encounter for screening for HIV  - HIV Antibody (routine testing w rflx)   5. Hyperglycemia  - Hemoglobin A1c  6. Stress

## 2020-06-26 ENCOUNTER — Ambulatory Visit: Payer: BC Managed Care – PPO | Admitting: Family Medicine

## 2020-06-26 ENCOUNTER — Other Ambulatory Visit: Payer: Self-pay

## 2020-06-26 ENCOUNTER — Encounter: Payer: Self-pay | Admitting: Family Medicine

## 2020-06-26 ENCOUNTER — Other Ambulatory Visit (HOSPITAL_COMMUNITY)
Admission: RE | Admit: 2020-06-26 | Discharge: 2020-06-26 | Disposition: A | Discharge status: Home / Self Care | Payer: BC Managed Care – PPO | Source: Ambulatory Visit | Attending: Family Medicine | Admitting: Family Medicine

## 2020-06-26 VITALS — BP 110/70 | HR 93 | Temp 97.8°F | Resp 16 | Ht 64.0 in | Wt 129.3 lb

## 2020-06-26 DIAGNOSIS — R87610 Atypical squamous cells of undetermined significance on cytologic smear of cervix (ASC-US): Secondary | ICD-10-CM

## 2020-06-26 DIAGNOSIS — R87810 Cervical high risk human papillomavirus (HPV) DNA test positive: Secondary | ICD-10-CM

## 2020-06-26 DIAGNOSIS — R21 Rash and other nonspecific skin eruption: Secondary | ICD-10-CM | POA: Diagnosis not present

## 2020-06-26 DIAGNOSIS — F439 Reaction to severe stress, unspecified: Secondary | ICD-10-CM

## 2020-06-26 DIAGNOSIS — R739 Hyperglycemia, unspecified: Secondary | ICD-10-CM

## 2020-06-26 DIAGNOSIS — Z114 Encounter for screening for human immunodeficiency virus [HIV]: Secondary | ICD-10-CM | POA: Diagnosis not present

## 2020-06-26 DIAGNOSIS — L299 Pruritus, unspecified: Secondary | ICD-10-CM | POA: Diagnosis not present

## 2020-06-27 ENCOUNTER — Ambulatory Visit: Payer: BC Managed Care – PPO | Admitting: Family Medicine

## 2020-06-30 LAB — COMPLETE METABOLIC PANEL WITH GFR
AG Ratio: 1.7 (calc) (ref 1.0–2.5)
ALT: 22 U/L (ref 6–29)
AST: 44 U/L — ABNORMAL HIGH (ref 10–35)
Albumin: 4.4 g/dL (ref 3.6–5.1)
Alkaline phosphatase (APISO): 67 U/L (ref 37–153)
BUN: 15 mg/dL (ref 7–25)
CO2: 28 mmol/L (ref 20–32)
Calcium: 10.1 mg/dL (ref 8.6–10.4)
Chloride: 104 mmol/L (ref 98–110)
Creat: 0.77 mg/dL (ref 0.50–1.05)
GFR, Est African American: 98 mL/min/{1.73_m2} (ref >=60)
GFR, Est Non African American: 85 mL/min/{1.73_m2} (ref >=60)
Globulin: 2.6 g/dL (calc) (ref 1.9–3.7)
Glucose, Bld: 106 mg/dL — ABNORMAL HIGH (ref 65–99)
Potassium: 5.1 mmol/L (ref 3.5–5.3)
Sodium: 139 mmol/L (ref 135–146)
Total Bilirubin: 0.4 mg/dL (ref 0.2–1.2)
Total Protein: 7 g/dL (ref 6.1–8.1)

## 2020-06-30 LAB — CBC WITH DIFFERENTIAL/PLATELET
Absolute Monocytes: 400 cells/uL (ref 200–950)
Basophils Absolute: 22 cells/uL (ref 0–200)
Basophils Relative: 0.4 %
Eosinophils Absolute: 140 cells/uL (ref 15–500)
Eosinophils Relative: 2.6 %
HCT: 39 % (ref 35.0–45.0)
Hemoglobin: 13.3 g/dL (ref 11.7–15.5)
Lymphs Abs: 2041 cells/uL (ref 850–3900)
MCH: 32.3 pg (ref 27.0–33.0)
MCHC: 34.1 g/dL (ref 32.0–36.0)
MCV: 94.7 fL (ref 80.0–100.0)
MPV: 11 fL (ref 7.5–12.5)
Monocytes Relative: 7.4 %
Neutro Abs: 2797 cells/uL (ref 1500–7800)
Neutrophils Relative %: 51.8 %
Platelets: 280 10*3/uL (ref 140–400)
RBC: 4.12 10*6/uL (ref 3.80–5.10)
RDW: 11.8 % (ref 11.0–15.0)
Total Lymphocyte: 37.8 %
WBC: 5.4 10*3/uL (ref 3.8–10.8)

## 2020-06-30 LAB — CYTOLOGY - PAP
Comment: NEGATIVE
Diagnosis: NEGATIVE
High risk HPV: NEGATIVE

## 2020-06-30 LAB — ANA,IFA RA DIAG PNL W/RFLX TIT/PATN
Anti Nuclear Antibody (ANA): NEGATIVE
Cyclic Citrullin Peptide Ab: <16 UNITS
Rheumatoid fact SerPl-aCnc: 14 IU/mL — ABNORMAL HIGH (ref <14)

## 2020-06-30 LAB — C-REACTIVE PROTEIN: CRP: 1 mg/L (ref <8.0)

## 2020-06-30 LAB — RPR: RPR Ser Ql: NONREACTIVE

## 2020-06-30 LAB — SEDIMENTATION RATE: Sed Rate: 17 mm/h (ref 0–30)

## 2020-06-30 LAB — HIV ANTIBODY (ROUTINE TESTING W REFLEX): HIV 1&2 Ab, 4th Generation: NONREACTIVE

## 2020-06-30 LAB — HEMOGLOBIN A1C
Hgb A1c MFr Bld: 5.8 % of total Hgb — ABNORMAL HIGH (ref <5.7)
Mean Plasma Glucose: 120 mg/dL
eAG (mmol/L): 6.6 mmol/L

## 2020-06-30 LAB — TSH: TSH: 2.27 mIU/L (ref 0.40–4.50)

## 2020-08-12 DIAGNOSIS — L2089 Other atopic dermatitis: Secondary | ICD-10-CM | POA: Diagnosis not present

## 2020-08-13 IMAGING — MG DIGITAL SCREENING BILATERAL MAMMOGRAM WITH TOMO AND CAD
8 series · 9 of 24 positions shown · non-contrast
Comparison: None.

CLINICAL DATA: Screening.

EXAM:
DIGITAL SCREENING BILATERAL MAMMOGRAM WITH TOMO AND CAD

[R MLO synth-2D]
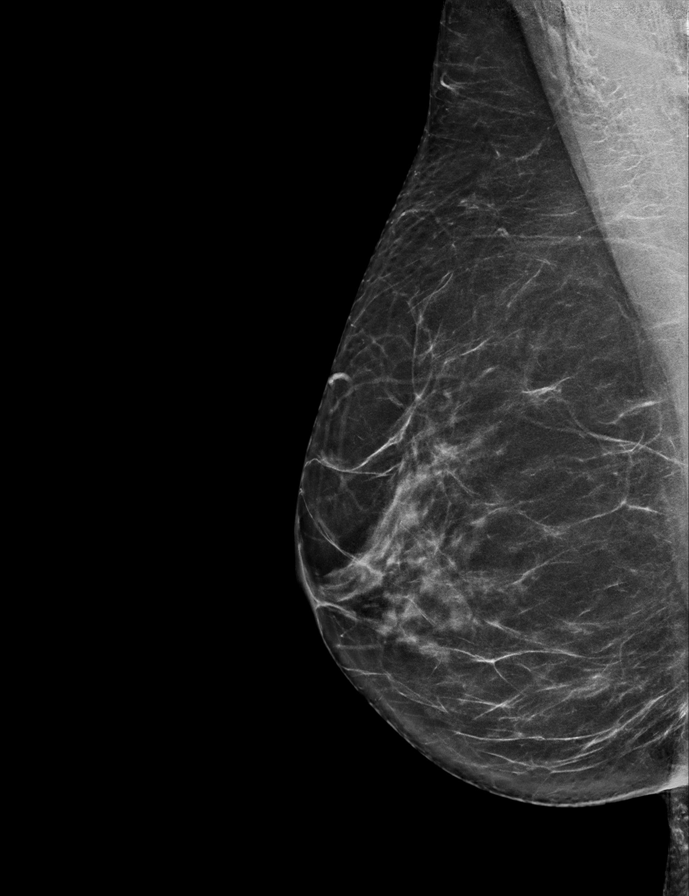

[L MLO synth-2D]
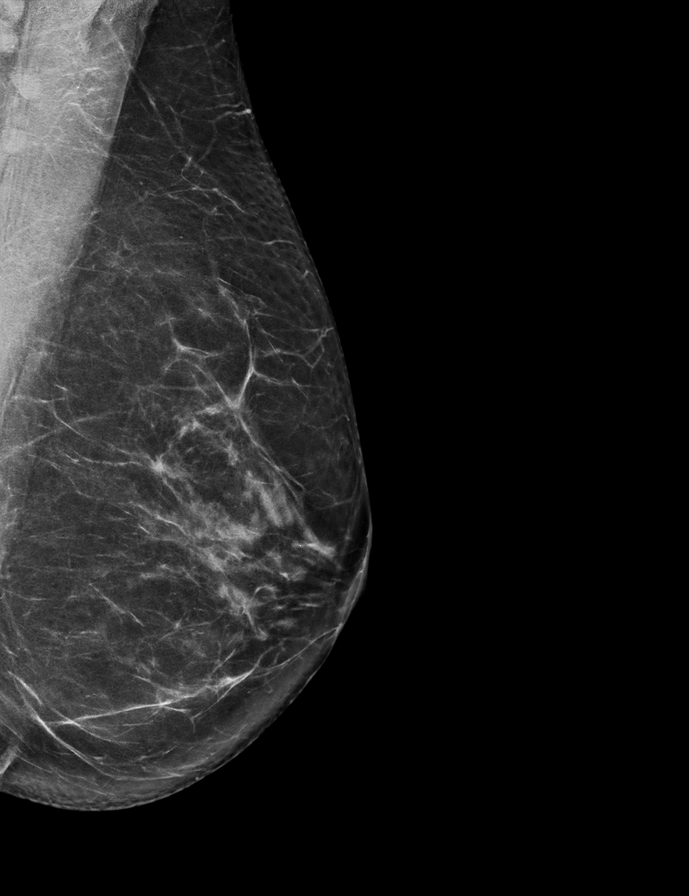

[L CC synth-2D]
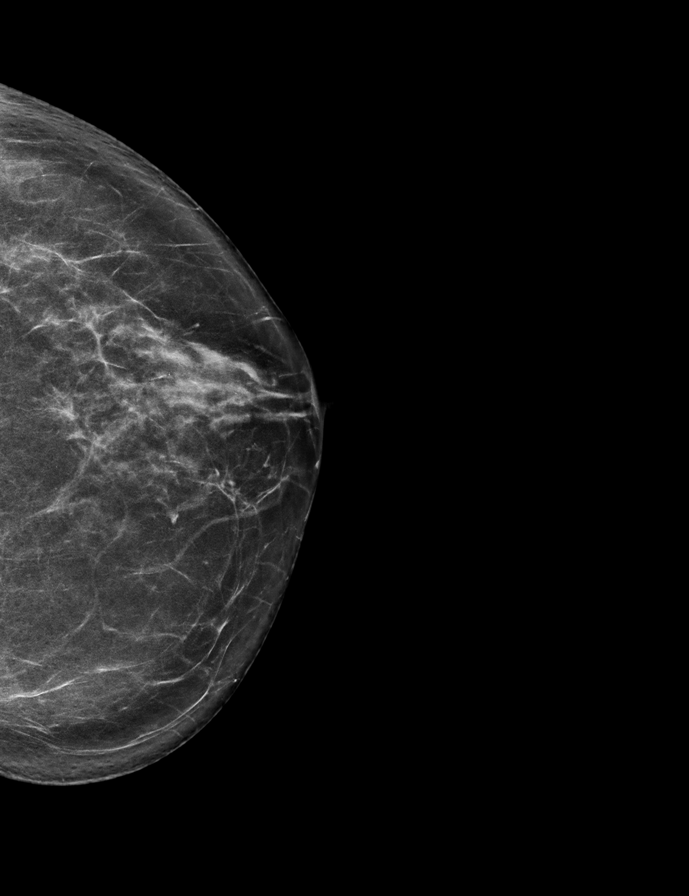

[R CC synth-2D]
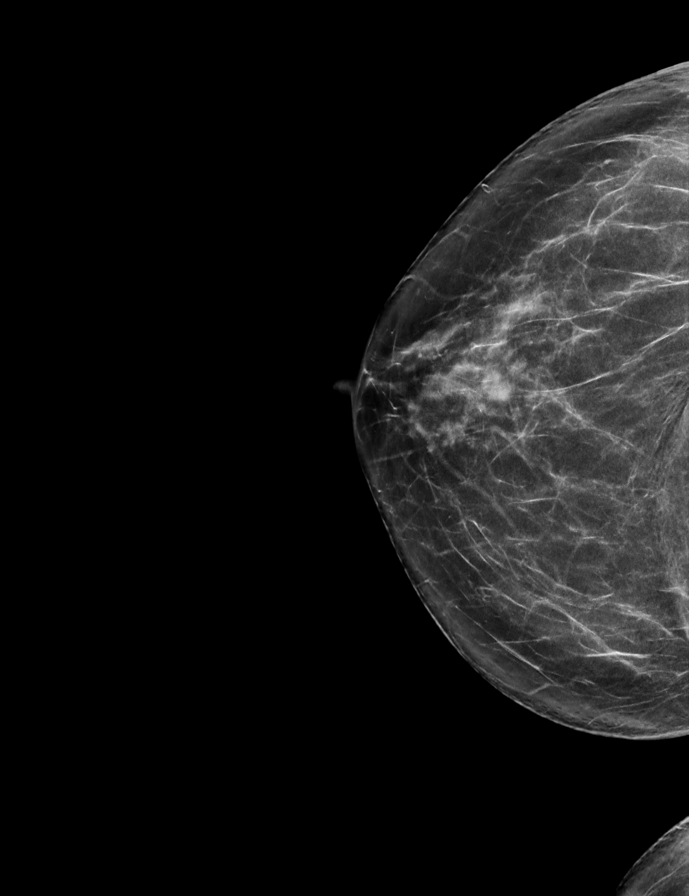

[R CC tomo · 2 of 68 frames shown]
[frame 22/68]
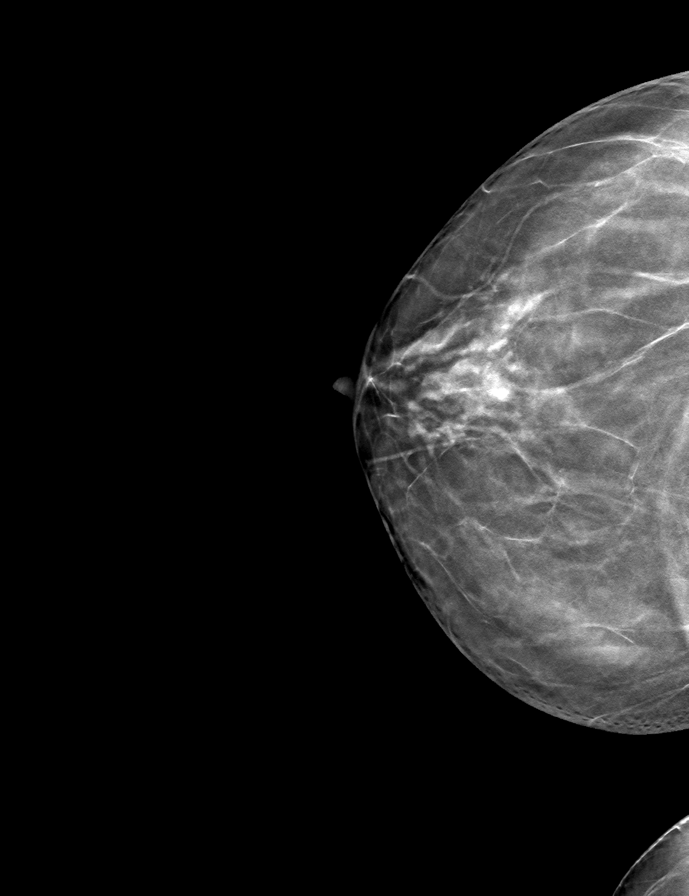
[frame 35/68]
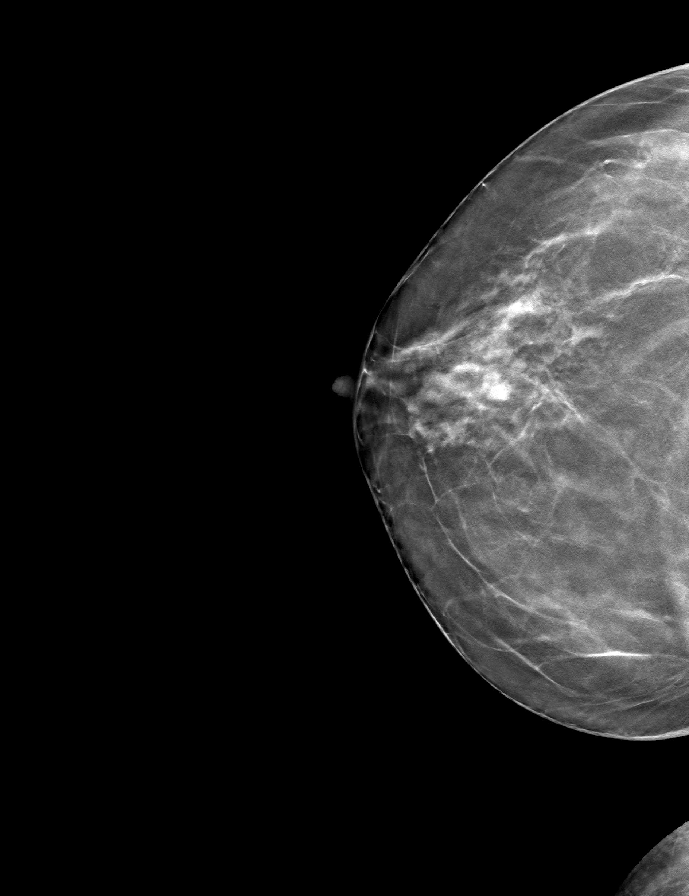

[R MLO tomo · tomo slice 35/68.0]
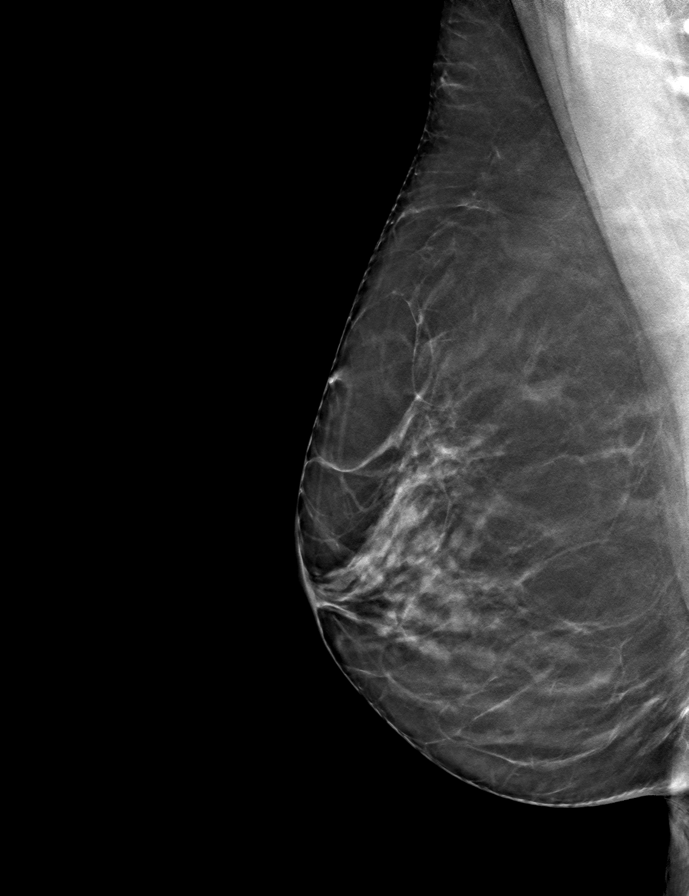

[L CC tomo · tomo slice 35/68.0]
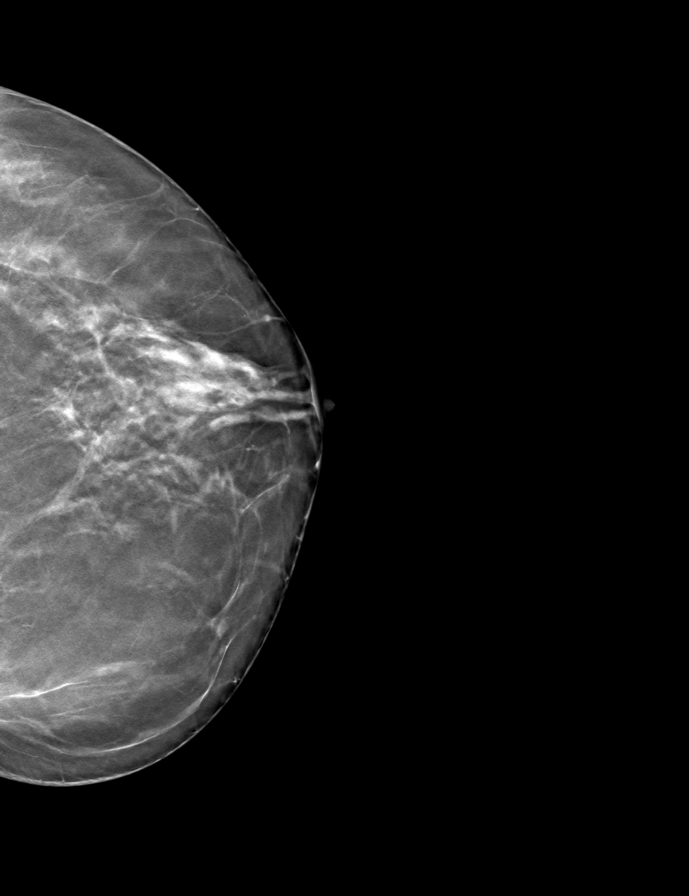

[L MLO tomo · tomo slice 35/70.0]
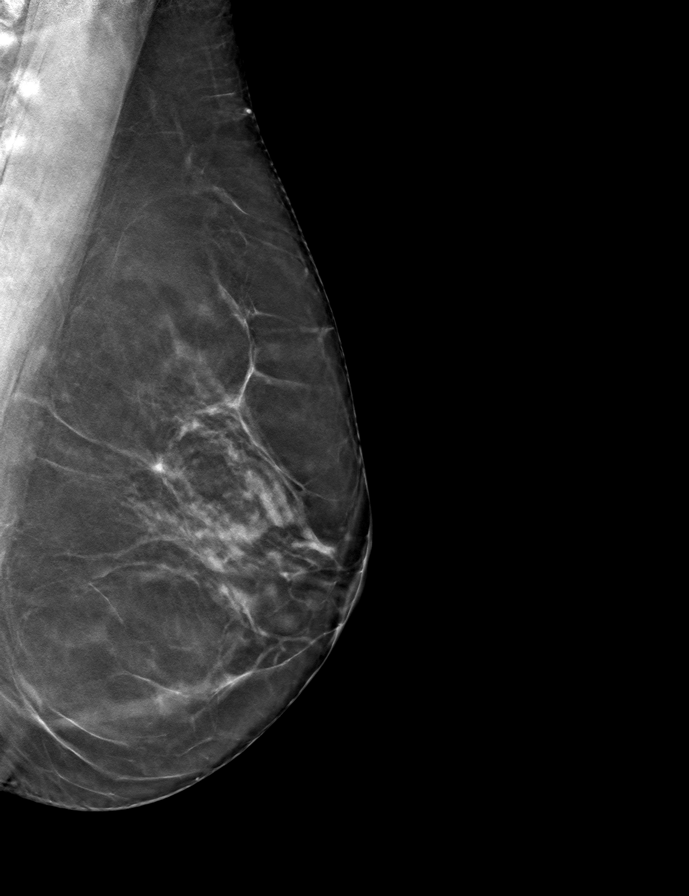

[9 of 24 positions shown; findings below may reference images not displayed]

ACR Breast Density Category b: There are scattered areas of
fibroglandular density.
FINDINGS: There are no findings suspicious for malignancy. Images were
processed with CAD.
IMPRESSION: No mammographic evidence of malignancy. A result letter of this
screening mammogram will be mailed directly to the patient.

RECOMMENDATION:
Screening mammogram in one year. (Code:[id number])

BI-RADS CATEGORY  1: Negative.

## 2020-10-04 ENCOUNTER — Other Ambulatory Visit: Payer: Self-pay | Admitting: Family Medicine

## 2020-10-06 ENCOUNTER — Ambulatory Visit: Payer: BC Managed Care – PPO | Admitting: Family Medicine

## 2020-10-23 DIAGNOSIS — Z20822 Contact with and (suspected) exposure to covid-19: Secondary | ICD-10-CM | POA: Diagnosis not present

## 2020-10-23 DIAGNOSIS — Z03818 Encounter for observation for suspected exposure to other biological agents ruled out: Secondary | ICD-10-CM | POA: Diagnosis not present

## 2020-11-04 ENCOUNTER — Encounter: Payer: Self-pay | Admitting: Family Medicine

## 2020-12-14 ENCOUNTER — Encounter: Payer: Self-pay | Admitting: Family Medicine

## 2021-01-03 ENCOUNTER — Other Ambulatory Visit: Payer: Self-pay | Admitting: Family Medicine

## 2021-01-03 NOTE — Telephone Encounter (Signed)
Requested Prescriptions  Pending Prescriptions Disp Refills  . rosuvastatin (CRESTOR) 10 MG tablet [Pharmacy Med Name: ROSUVASTATIN CALCIUM 10 MG TAB] 90 tablet 0    Sig: TAKE 1 TABLET BY MOUTH EVERY DAY     Cardiovascular:  Antilipid - Statins Passed - 01/03/2021 10:13 AM      Passed - Total Cholesterol in normal range and within 360 days    Cholesterol  Date Value Ref Range Status  04/07/2020 184 <200 mg/dL Final         Passed - LDL in normal range and within 360 days    LDL Cholesterol (Calc)  Date Value Ref Range Status  04/07/2020 81 mg/dL (calc) Final    Comment:    Reference range: <100 . Desirable range <100 mg/dL for primary prevention;   <70 mg/dL for patients with CHD or diabetic patients  with > or = 2 CHD risk factors. Marland Kitchen LDL-C is now calculated using the Martin-Hopkins  calculation, which is a validated novel method providing  better accuracy than the Friedewald equation in the  estimation of LDL-C.  Horald Pollen et al. Lenox Ahr. 0630;160(10): 2061-2068  (http://education.[facility].com/faq/FAQ164)          Passed - HDL in normal range and within 360 days    HDL  Date Value Ref Range Status  04/07/2020 87 > OR = 50 mg/dL Final         Passed - Triglycerides in normal range and within 360 days    Triglycerides  Date Value Ref Range Status  04/07/2020 75 <150 mg/dL Final         Passed - Patient is not pregnant      Passed - Valid encounter within last 12 months    Recent Outpatient Visits          6 months ago ASCUS with positive high risk HPV cervical   [facility] Alba Cory, MD   9 months ago Hyperlipidemia, unspecified hyperlipidemia type   [facility] [place], Sheliah Mends, PA-C   11 months ago Hyperlipidemia, unspecified hyperlipidemia type   [facility] Danelle Berry, PA-C   1 year ago Hyperlipidemia, unspecified hyperlipidemia type   [facility] Danelle Berry, PA-C    1 year ago Well woman exam   [facility] Doren Custard, [place]      Future Appointments            In 3 weeks Alba Cory, MD [facility], [facility]

## 2021-01-26 ENCOUNTER — Ambulatory Visit (INDEPENDENT_AMBULATORY_CARE_PROVIDER_SITE_OTHER): Payer: BC Managed Care – PPO | Admitting: Family Medicine

## 2021-01-26 ENCOUNTER — Encounter: Payer: Self-pay | Admitting: Family Medicine

## 2021-01-26 VITALS — BP 126/64 | HR 81 | Temp 98.2°F | Resp 16 | Ht 64.0 in | Wt 133.0 lb

## 2021-01-26 DIAGNOSIS — L309 Dermatitis, unspecified: Secondary | ICD-10-CM

## 2021-01-26 DIAGNOSIS — Z1231 Encounter for screening mammogram for malignant neoplasm of breast: Secondary | ICD-10-CM | POA: Diagnosis not present

## 2021-01-26 DIAGNOSIS — R739 Hyperglycemia, unspecified: Secondary | ICD-10-CM | POA: Diagnosis not present

## 2021-01-26 DIAGNOSIS — E782 Mixed hyperlipidemia: Secondary | ICD-10-CM

## 2021-01-26 DIAGNOSIS — D519 Vitamin B12 deficiency anemia, unspecified: Secondary | ICD-10-CM | POA: Diagnosis not present

## 2021-01-26 DIAGNOSIS — Z23 Encounter for immunization: Secondary | ICD-10-CM

## 2021-01-26 DIAGNOSIS — K219 Gastro-esophageal reflux disease without esophagitis: Secondary | ICD-10-CM | POA: Diagnosis not present

## 2021-01-26 DIAGNOSIS — Z0000 Encounter for general adult medical examination without abnormal findings: Secondary | ICD-10-CM

## 2021-01-26 DIAGNOSIS — Z01419 Encounter for gynecological examination (general) (routine) without abnormal findings: Secondary | ICD-10-CM | POA: Diagnosis not present

## 2021-01-26 DIAGNOSIS — Z124 Encounter for screening for malignant neoplasm of cervix: Secondary | ICD-10-CM | POA: Diagnosis not present

## 2021-01-26 DIAGNOSIS — E785 Hyperlipidemia, unspecified: Secondary | ICD-10-CM | POA: Diagnosis not present

## 2021-01-26 DIAGNOSIS — L659 Nonscarring hair loss, unspecified: Secondary | ICD-10-CM

## 2021-01-26 NOTE — Progress Notes (Signed)
Name: Jaime Rice   MRN: [id number]    DOB: 1960-05-03   Date:01/26/2021       Progress Note  Subjective  Chief Complaint  Chief Complaint  Patient presents with   Annual Exam    HPI  Patient presents for annual CPE.  Hair loss: going on for the past 6 months, no bald spots, no fatigue. She states hair comes done in clumps.   Hyperlipidemia: taking medication and denies myalgias. No chest pain or palpitation  Hyperglycemia: denies polyphagia, polyuria or polydipsia. We will recheck A1C  Diet: needs to eat more vegetables  Exercise: continue regular physical activity    [place] Office Visit from 04/07/2020 in [facility]  AUDIT-C Score 1      Depression: Phq 9 is  negative Depression screen [facility] 2/9 01/26/2021 06/26/2020 04/07/2020 02/05/2020 10/01/2019  Decreased Interest 0 0 0 0 0  Down, Depressed, Hopeless 0 0 0 0 0  PHQ - 2 Score 0 0 0 0 0  Altered sleeping - - - - 0  Tired, decreased energy - - - - 0  Change in appetite - - - - 0  Feeling bad or failure about yourself  - - - - 0  Trouble concentrating - - - - 0  Moving slowly or fidgety/restless - - - - 0  Suicidal thoughts - - - - 0  PHQ-9 Score - - - - 0  Difficult doing work/chores - Not difficult at all - - Not difficult at all   Hypertension: BP Readings from Last 3 Encounters:  01/26/21 126/64  06/26/20 110/70  04/07/20 112/74   Obesity: Wt Readings from Last 3 Encounters:  01/26/21 133 lb (60.3 kg)  06/26/20 129 lb 4.8 oz (58.7 kg)  04/07/20 133 lb (60.3 kg)   BMI Readings from Last 3 Encounters:  01/26/21 22.83 kg/m  06/26/20 22.19 kg/m  04/07/20 22.83 kg/m     Vaccines:   Shingrix: today  Pneumonia: educated and discussed with patient. Flu: educated and discussed with patient.  Hep C Screening: 12/21/18 STD testing and prevention (HIV/chl/gon/syphilis): 06/26/20 Intimate partner violence: negative Sexual History : no pain  Menstrual History/LMP/Abnormal  Bleeding: discussed post menopausal bleeding  Incontinence Symptoms: no problems   Breast cancer:  - Last Mammogram: ordered today - BRCA gene screening: N/A  Osteoporosis: Discussed high calcium and vitamin D supplementation, weight bearing exercises  Cervical cancer screening: 06/26/20  Skin cancer: Discussed monitoring for atypical lesions  Colorectal cancer: N/A   Lung cancer:  Low Dose CT Chest recommended if Age 31-80 years, 20 pack-year currently smoking OR have quit w/in 15years. Patient does not qualify.   ECG: N/A  Advanced Care Planning: A voluntary discussion about advance care planning including the explanation and discussion of advance directives.  Discussed health care proxy and Living will, and the patient was able to identify a health care proxy as husband.  Patient does not have a living will at present time. If patient does have living will, I have requested they bring this to the clinic to be scanned in to their chart.  Lipids: Lab Results  Component Value Date   CHOL 184 04/07/2020   CHOL 259 (H) 10/01/2019   CHOL 250 (H) 07/14/2018   Lab Results  Component Value Date   HDL 87 04/07/2020   HDL 87 10/01/2019   HDL 80 07/14/2018   Lab Results  Component Value Date   LDLCALC 81 04/07/2020   LDLCALC 153 (H) 10/01/2019  LDLCALC 151 (H) 07/14/2018   Lab Results  Component Value Date   TRIG 75 04/07/2020   TRIG 82 10/01/2019   TRIG 88 07/14/2018   Lab Results  Component Value Date   CHOLHDL 2.1 04/07/2020   CHOLHDL 3.0 10/01/2019   CHOLHDL 3.1 07/14/2018   No results found for: LDLDIRECT  Glucose: Glucose, Bld  Date Value Ref Range Status  06/26/2020 106 (H) 65 - 99 mg/dL Final    Comment:    .            Fasting reference interval . For someone without known diabetes, a glucose value between 100 and 125 mg/dL is consistent with prediabetes and should be confirmed with a follow-up test. .   04/07/2020 100 (H) 65 - 99 mg/dL Final     Comment:    .            Fasting reference interval . For someone without known diabetes, a glucose value between 100 and 125 mg/dL is consistent with prediabetes and should be confirmed with a follow-up test. .   10/01/2019 96 65 - 99 mg/dL Final    Comment:    .            Fasting reference interval .     Patient Active Problem List   Diagnosis Date Noted   Eczema 02/05/2020   Herniated disc, cervical 01/30/2019   Gastroesophageal reflux disease 07/14/2018   History of colon polyps 07/14/2018   Cervicalgia 07/14/2018   Other insomnia 07/14/2018   Elevated LFTs 07/14/2018   Hyperlipidemia 07/14/2018    No past surgical history on file.  Family History  Problem Relation Age of Onset   Uterine cancer Mother 9   Prostate cancer Father 15    Social History   Socioeconomic History   Marital status: Married    Spouse name: Duanne Moron   Number of children: 1   Years of education: Not on file   Highest education level: Not on file  Occupational History   Not on file  Tobacco Use   Smoking status: Never   Smokeless tobacco: Never  Vaping Use   Vaping Use: Never used  Substance and Sexual Activity   Alcohol use: Yes    Comment: occasional   Drug use: Never   Sexual activity: Yes    Partners: Male    Birth control/protection: Post-menopausal  Other Topics Concern   Not on file  Social History Narrative   Moved from [place] (Originally from [place] - moved to [place] after college), moved to [place] in October 2019 to live with her husband.  Has 1 daughter who lives in [place]. She has 1 dog.  She is still searching for a job.   Social Determinants of Health   Financial Resource Strain: Low Risk    Difficulty of Paying Living Expenses: Not hard at all  Food Insecurity: No Food Insecurity   Worried About Charity fundraiser in the Last Year: Never true   [place] in the Last Year: Never true  Transportation Needs: No Transportation Needs   Lack of  Transportation (Medical): No   Lack of Transportation (Non-Medical): No  Physical Activity: Sufficiently Active   Days of Exercise per Week: 7 days   Minutes of Exercise per Session: 40 min  Stress: Stress Concern Present   Feeling of Stress : To some extent  Social Connections: Moderately Isolated   Frequency of Communication with Friends and Family: More than three  times a week   Frequency of Social Gatherings with Friends and Family: More than three times a week   Attends Religious Services: Never   Marine scientist or Organizations: No   Attends Music therapist: Never   Marital Status: Married  Human resources officer Violence: Not At Risk   Fear of Current or Ex-Partner: No   Emotionally Abused: No   Physically Abused: No   Sexually Abused: No     Current Outpatient Medications:    famotidine (PEPCID) 40 MG tablet, Take 1 tablet (40 mg total) by mouth daily as needed for heartburn or indigestion., Disp: 90 tablet, Rfl: 3   omeprazole (PRILOSEC) 20 MG capsule, Take 1 capsule (20 mg total) by mouth daily as needed (recommend using for 14 d treatment courses at a time when needed)., Disp: 30 capsule, Rfl: 3   rosuvastatin (CRESTOR) 10 MG tablet, TAKE 1 TABLET BY MOUTH EVERY DAY, Disp: 90 tablet, Rfl: 0   hydrOXYzine (ATARAX/VISTARIL) 25 MG tablet, Take 25 mg by mouth 2 (two) times daily. (Patient not taking: Reported on 01/26/2021), Disp: , Rfl:    ketoconazole (NIZORAL) 2 % shampoo, Apply 1 application topically 2 (two) times a week. Apply to scalp for seborrheic dermatitis (Patient not taking: Reported on 01/26/2021), Disp: 120 mL, Rfl: 3   mometasone (ELOCON) 0.1 % ointment, Apply 1 application topically 2 (two) times daily. (Patient not taking: Reported on 01/26/2021), Disp: , Rfl:    triamcinolone (KENALOG) 0.025 % cream, Apply topically. (Patient not taking: Reported on 01/26/2021), Disp: , Rfl:   No Known Allergies   ROS  Constitutional: Negative for fever or  weight change.  Respiratory: Negative for cough and shortness of breath.   Cardiovascular: Negative for chest pain or palpitations.  Gastrointestinal: Negative for abdominal pain, no bowel changes.  Musculoskeletal: Negative for gait problem or joint swelling.  Skin: Negative for rash.  Neurological: Negative for dizziness or headache.  No other specific complaints in a complete review of systems (except as listed in HPI above).   Objective  Vitals:   01/26/21 0935  BP: 126/64  Pulse: 81  Resp: 16  Temp: 98.2 F (36.8 C)  SpO2: 99%  Weight: 133 lb (60.3 kg)  Height: $Remove'5\' 4"'nSfQyKG$  (1.626 m)    Body mass index is 22.83 kg/m.  Physical Exam  Constitutional: Patient appears well-developed and well-nourished. No distress.  HENT: Head: Normocephalic and atraumatic. Ears: B TMs ok, no erythema or effusion; Nose: Nose normal. Mouth/Throat: not done  Eyes: Conjunctivae and EOM are normal. Pupils are equal, round, and reactive to light. No scleral icterus.  Neck: Normal range of motion. Neck supple. No JVD present. No thyromegaly present.  Cardiovascular: Normal rate, regular rhythm and normal heart sounds.  No murmur heard. No BLE edema. Pulmonary/Chest: Effort normal and breath sounds normal. No respiratory distress. Abdominal: Soft. Bowel sounds are normal, no distension. There is no tenderness. no masses Breast: no lumps or masses, no nipple discharge or rashes FEMALE GENITALIA:  External genitalia normal External urethra normal Vaginal atrophy  Cervix normal without discharge or lesions Bimanual exam normal without masses RECTAL: not done  Musculoskeletal: Normal range of motion, no joint effusions. No gross deformities Neurological: he is alert and oriented to person, place, and time. No cranial nerve deficit. Coordination, balance, strength, speech and gait are normal.  Skin: Skin is warm and dry. No rash noted. No erythema.  Psychiatric: Patient has a normal mood and affect.  behavior is normal. Judgment  and thought content normal.    Fall Risk: Fall Risk  01/26/2021 06/26/2020 04/07/2020 02/05/2020 10/01/2019  Falls in the past year? 0 0 0 0 0  Number falls in past yr: 0 0 0 0 0  Injury with Fall? 0 0 0 0 0  Risk for fall due to : No Fall Risks - - - -  Follow up Falls prevention discussed - Falls evaluation completed Falls evaluation completed -     Functional Status Survey: Is the patient deaf or have difficulty hearing?: No Does the patient have difficulty seeing, even when wearing glasses/contacts?: No Does the patient have difficulty concentrating, remembering, or making decisions?: No Does the patient have difficulty walking or climbing stairs?: No Does the patient have difficulty dressing or bathing?: No Does the patient have difficulty doing errands alone such as visiting a doctor's office or shopping?: No  Assessment & Plan  1. Well woman exam  - Lipid panel - CBC with Differential/Platelet - COMPLETE METABOLIC PANEL WITH GFR - TSH - Hemoglobin A1c - B12 and Folate Panel - Cytology - PAP  2. Cervical cancer screening  - Cytology - PAP  3. Encounter for screening mammogram for malignant neoplasm of breast   4. Eczema, unspecified type  Doing well at this time   5. Hyperglycemia  - Hemoglobin A1c  6. Gastroesophageal reflux disease, unspecified whether esophagitis present   7. Mixed hyperlipidemia  - Lipid panel  8. Hair loss  - CBC with Differential/Platelet - COMPLETE METABOLIC PANEL WITH GFR - B12 and Folate Panel  9. Needs flu shot  - Flu Vaccine QUAD 6+ mos PF IM (Fluarix Quad PF)  10. Need for shingles vaccine  - Varicella-zoster vaccine IM   -USPSTF grade A and B recommendations reviewed with patient; age-appropriate recommendations, preventive care, screening tests, etc discussed and encouraged; healthy living encouraged; see AVS for patient education given to patient -Discussed importance of 150 minutes of  physical activity weekly, eat two servings of fish weekly, eat one serving of tree nuts ( cashews, pistachios, pecans, almonds.Marland Kitchen) every other day, eat 6 servings of fruit/vegetables daily and drink plenty of water and avoid sweet beverages.

## 2021-01-27 LAB — CBC WITH DIFFERENTIAL/PLATELET
Absolute Monocytes: 322 cells/uL (ref 200–950)
Basophils Absolute: 9 cells/uL (ref 0–200)
Basophils Relative: 0.2 %
Eosinophils Absolute: 78 cells/uL (ref 15–500)
Eosinophils Relative: 1.7 %
HCT: 40 % (ref 35.0–45.0)
Hemoglobin: 13.4 g/dL (ref 11.7–15.5)
Lymphs Abs: 1881 cells/uL (ref 850–3900)
MCH: 31.5 pg (ref 27.0–33.0)
MCHC: 33.5 g/dL (ref 32.0–36.0)
MCV: 93.9 fL (ref 80.0–100.0)
MPV: 10.7 fL (ref 7.5–12.5)
Monocytes Relative: 7 %
Neutro Abs: 2309 cells/uL (ref 1500–7800)
Neutrophils Relative %: 50.2 %
Platelets: 270 10*3/uL (ref 140–400)
RBC: 4.26 10*6/uL (ref 3.80–5.10)
RDW: 12.5 % (ref 11.0–15.0)
Total Lymphocyte: 40.9 %
WBC: 4.6 10*3/uL (ref 3.8–10.8)

## 2021-01-27 LAB — LIPID PANEL
Cholesterol: 182 mg/dL (ref <200)
HDL: 90 mg/dL (ref >=50)
LDL Cholesterol (Calc): 79 mg/dL (calc)
Non-HDL Cholesterol (Calc): 92 mg/dL (calc) (ref <130)
Total CHOL/HDL Ratio: 2 (calc) (ref <5.0)
Triglycerides: 52 mg/dL (ref <150)

## 2021-01-27 LAB — TSH+FREE T4: TSH W/REFLEX TO FT4: 1.38 mIU/L (ref 0.40–4.50)

## 2021-01-27 LAB — COMPLETE METABOLIC PANEL WITH GFR
AG Ratio: 1.9 (calc) (ref 1.0–2.5)
ALT: 35 U/L — ABNORMAL HIGH (ref 6–29)
AST: 54 U/L — ABNORMAL HIGH (ref 10–35)
Albumin: 4.3 g/dL (ref 3.6–5.1)
Alkaline phosphatase (APISO): 68 U/L (ref 37–153)
BUN: 18 mg/dL (ref 7–25)
CO2: 25 mmol/L (ref 20–32)
Calcium: 9.4 mg/dL (ref 8.6–10.4)
Chloride: 104 mmol/L (ref 98–110)
Creat: 0.75 mg/dL (ref 0.50–1.03)
Globulin: 2.3 g/dL (calc) (ref 1.9–3.7)
Glucose, Bld: 96 mg/dL (ref 65–99)
Potassium: 4.3 mmol/L (ref 3.5–5.3)
Sodium: 138 mmol/L (ref 135–146)
Total Bilirubin: 0.4 mg/dL (ref 0.2–1.2)
Total Protein: 6.6 g/dL (ref 6.1–8.1)
eGFR: 92 mL/min/{1.73_m2} (ref >=60)

## 2021-01-27 LAB — B12 AND FOLATE PANEL
Folate: 18 ng/mL
Vitamin B-12: 507 pg/mL (ref 200–1100)

## 2021-01-27 LAB — HEMOGLOBIN A1C
Hgb A1c MFr Bld: 5.6 % of total Hgb (ref <5.7)
Mean Plasma Glucose: 114 mg/dL
eAG (mmol/L): 6.3 mmol/L

## 2021-01-27 NOTE — Addendum Note (Signed)
Addended by: Alba Cory F on: 01/27/2021 12:26 PM   Modules accepted: Orders

## 2021-03-21 ENCOUNTER — Other Ambulatory Visit: Payer: Self-pay | Admitting: Family Medicine

## 2021-03-22 NOTE — Telephone Encounter (Signed)
Requested Prescriptions  Pending Prescriptions Disp Refills  . rosuvastatin (CRESTOR) 10 MG tablet [Pharmacy Med Name: ROSUVASTATIN CALCIUM 10 MG TAB] 90 tablet 2    Sig: TAKE 1 TABLET BY MOUTH EVERY DAY     Cardiovascular:  Antilipid - Statins Passed - 03/21/2021  9:35 AM      Passed - Total Cholesterol in normal range and within 360 days    Cholesterol  Date Value Ref Range Status  01/26/2021 182 <200 mg/dL Final         Passed - LDL in normal range and within 360 days    LDL Cholesterol (Calc)  Date Value Ref Range Status  01/26/2021 79 mg/dL (calc) Final    Comment:    Reference range: <100 . Desirable range <100 mg/dL for primary prevention;   <70 mg/dL for patients with CHD or diabetic patients  with > or = 2 CHD risk factors. Marland Kitchen LDL-C is now calculated using the Martin-Hopkins  calculation, which is a validated novel method providing  better accuracy than the Friedewald equation in the  estimation of LDL-C.  Horald Pollen et al. Lenox Ahr. 4650;354(65): 2061-2068  (http://education.[facility].com/faq/FAQ164)          Passed - HDL in normal range and within 360 days    HDL  Date Value Ref Range Status  01/26/2021 90 > OR = 50 mg/dL Final         Passed - Triglycerides in normal range and within 360 days    Triglycerides  Date Value Ref Range Status  01/26/2021 52 <150 mg/dL Final         Passed - Patient is not pregnant      Passed - Valid encounter within last 12 months    Recent Outpatient Visits          1 month ago Hyperglycemia   [facility], Danna Hefty, MD   8 months ago ASCUS with positive high risk HPV cervical   [facility] Alba Cory, MD   11 months ago Hyperlipidemia, unspecified hyperlipidemia type   [facility] Danelle Berry, PA-C   1 year ago Hyperlipidemia, unspecified hyperlipidemia type   [facility] Danelle Berry, PA-C   1 year ago Hyperlipidemia,  unspecified hyperlipidemia type   [facility] Danelle Berry, PA-C      Future Appointments            In 4 months Alba Cory, MD [facility], [facility]

## 2021-06-22 ENCOUNTER — Other Ambulatory Visit: Payer: Self-pay | Admitting: Family Medicine

## 2021-06-22 DIAGNOSIS — K219 Gastro-esophageal reflux disease without esophagitis: Secondary | ICD-10-CM

## 2021-06-29 ENCOUNTER — Other Ambulatory Visit: Payer: Self-pay | Admitting: Family Medicine

## 2021-06-29 DIAGNOSIS — Z1231 Encounter for screening mammogram for malignant neoplasm of breast: Secondary | ICD-10-CM

## 2021-07-01 ENCOUNTER — Ambulatory Visit
Admission: RE | Admit: 2021-07-01 | Discharge: 2021-07-01 | Disposition: A | Discharge status: Home / Self Care | Payer: 59 | Source: Ambulatory Visit

## 2021-07-01 DIAGNOSIS — Z1231 Encounter for screening mammogram for malignant neoplasm of breast: Secondary | ICD-10-CM

## 2021-07-27 ENCOUNTER — Ambulatory Visit: Payer: BC Managed Care – PPO | Admitting: Family Medicine

## 2021-08-14 NOTE — Progress Notes (Signed)
Name: Jaime Rice   MRN: [id number]    DOB: Dec 18, 1960   Date:08/17/2021 ? ?     Progress Note ? ?Subjective ? ?Chief Complaint ? ?Follow Up ? ?HPI ? ?Hair loss: she noticed symptoms started a few months after she had COVID-19 in July 2022. She states still falls off in clumps , no bald spots ? ?Melasma: she has noticed some brown spots on her face present for the past few years , not changing in size since it appeared , we will refer her to dermatologist  ? ?Hyperlipidemia: she is compliant with  Rosuvastatin  No chest pain or palpitation ? ?GERD: she has chronic gastritis, had EGD done at [place] in 2021 and showed chronic gastritis, she takes pepcid daily and occasionally PPI. She still has symptoms at night, but controlled if she takes Pepcid before bed.  ? ?Hyperglycemia: denies polyphagia, polyuria or polydipsia. Last A1C was 5.6%  ? ?Elevated liver enzymes:  we will recheck next visit , she states evaluated by GI in the past and negative work up  ? ?Patient Active Problem List  ? Diagnosis Date Noted  ? Eczema 02/05/2020  ? Herniated disc, cervical 01/30/2019  ? Gastroesophageal reflux disease 07/14/2018  ? History of colon polyps 07/14/2018  ? Cervicalgia 07/14/2018  ? Other insomnia 07/14/2018  ? Elevated LFTs 07/14/2018  ? Hyperlipidemia 07/14/2018  ? ? ?No past surgical history on file. ? ?Family History  ?Problem Relation Age of Onset  ? Uterine cancer Mother 28  ? Prostate cancer Father 23  ? ? ?Social History  ? ?Tobacco Use  ? Smoking status: Never  ? Smokeless tobacco: Never  ?Substance Use Topics  ? Alcohol use: Yes  ?  Comment: occasional  ? ? ? ?Current Outpatient Medications:  ?  omeprazole (PRILOSEC) 20 MG capsule, TAKE 1 CAPSULE (20 MG TOTAL) BY MOUTH DAILY AS NEEDED (RECOMMEND USING FOR 14 D TREATMENT COURSES AT A TIME WHEN NEEDED)., Disp: 90 capsule, Rfl: 0 ?  rosuvastatin (CRESTOR) 10 MG tablet, TAKE 1 TABLET BY MOUTH EVERY DAY, Disp: 90 tablet, Rfl: 2 ? ?No Known Allergies ? ?I personally  reviewed active problem list, medication list, allergies, family history, social history, health maintenance with the patient/caregiver today. ? ? ?ROS ? ?Constitutional: Negative for fever or weight change.  ?Respiratory: Negative for cough and shortness of breath.   ?Cardiovascular: Negative for chest pain or palpitations.  ?Gastrointestinal: Negative for abdominal pain, no bowel changes.  ?Musculoskeletal: Negative for gait problem or joint swelling.  ?Skin: Negative for rash.  ?Neurological: Negative for dizziness or headache.  ?No other specific complaints in a complete review of systems (except as listed in HPI above).  ? ?Objective ? ?Vitals:  ? 08/17/21 1118  ?BP: 126/74  ?Pulse: 74  ?Resp: 16  ?SpO2: 98%  ?Weight: 133 lb (60.3 kg)  ?Height: 5\' 4"  (1.626 m)  ? ? ?Body mass index is 22.83 kg/m?. ? ?Physical Exam ? ?Constitutional: Patient appears well-developed and well-nourished.  No distress.  ?HEENT: head atraumatic, normocephalic, pupils equal and reactive to light, neck supple ?Cardiovascular: Normal rate, regular rhythm and normal heart sounds.  No murmur heard. No BLE edema. ?Pulmonary/Chest: Effort normal and breath sounds normal. No respiratory distress. ?Abdominal: Soft.  There is no tenderness. ?Skin: melasma face  ?Psychiatric: Patient has a normal mood and affect. behavior is normal. Judgment and thought content normal.  ? ?PHQ2/9: ? ?  08/17/2021  ? 11:17 AM 01/26/2021  ?  9:38 AM 06/26/2020  ?  8:27 AM 04/07/2020  ?  9:32 AM 02/05/2020  ?  9:45 AM  ?Depression screen PHQ 2/9  ?Decreased Interest 0 0 0 0 0  ?Down, Depressed, Hopeless 0 0 0 0 0  ?PHQ - 2 Score 0 0 0 0 0  ?Altered sleeping 0      ?Tired, decreased energy 0      ?Change in appetite 0      ?Feeling bad or failure about yourself  0      ?Trouble concentrating 0      ?Moving slowly or fidgety/restless 0      ?Suicidal thoughts 0      ?PHQ-9 Score 0      ?Difficult doing work/chores   Not difficult at all    ?  ?phq 9 is negative ? ? ?Fall  Risk: ? ?  08/17/2021  ? 11:17 AM 01/26/2021  ?  9:38 AM 06/26/2020  ?  8:26 AM 04/07/2020  ?  9:31 AM 02/05/2020  ?  9:45 AM  ?Fall Risk   ?Falls in the past year? 0 0 0 0 0  ?Number falls in past yr: 0 0 0 0 0  ?Injury with Fall? 0 0 0 0 0  ?Risk for fall due to : No Fall Risks No Fall Risks     ?Follow up Falls prevention discussed Falls prevention discussed  Falls evaluation completed Falls evaluation completed  ? ? ? ? ?Functional Status Survey: ?Is the patient deaf or have difficulty hearing?: No ?Does the patient have difficulty seeing, even when wearing glasses/contacts?: No ?Does the patient have difficulty concentrating, remembering, or making decisions?: No ?Does the patient have difficulty walking or climbing stairs?: No ?Does the patient have difficulty dressing or bathing?: No ?Does the patient have difficulty doing errands alone such as visiting a doctor's office or shopping?: No ? ? ? ?Assessment & Plan ? ?1. Mixed hyperlipidemia ? ?On statin therapy  ? ?2. Gastroesophageal reflux disease, unspecified whether esophagitis present ? ?- famotidine (PEPCID) 20 MG tablet; Take 1 tablet (20 mg total) by mouth 2 (two) times daily.  Dispense: 90 tablet; Refill: 3 ? ?3. Hyperglycemia ? ? ?4. Hair loss ? ?- Ambulatory referral to Dermatology ? ?5. Flexural eczema ? ?Doing well at this time ? ?6. Need for shingles vaccine ? ?- Varicella-zoster vaccine IM ? ?7. Melasma ? ?- Ambulatory referral to Dermatology ? ?8. Left foot pain ? ?- Ambulatory referral to Podiatry  ?

## 2021-08-17 ENCOUNTER — Encounter: Payer: Self-pay | Admitting: Family Medicine

## 2021-08-17 ENCOUNTER — Ambulatory Visit: Payer: 59 | Admitting: Family Medicine

## 2021-08-17 ENCOUNTER — Other Ambulatory Visit: Payer: Self-pay | Admitting: Family Medicine

## 2021-08-17 VITALS — BP 126/74 | HR 74 | Resp 16 | Ht 64.0 in | Wt 133.0 lb

## 2021-08-17 DIAGNOSIS — Z23 Encounter for immunization: Secondary | ICD-10-CM | POA: Diagnosis not present

## 2021-08-17 DIAGNOSIS — L2082 Flexural eczema: Secondary | ICD-10-CM

## 2021-08-17 DIAGNOSIS — K219 Gastro-esophageal reflux disease without esophagitis: Secondary | ICD-10-CM

## 2021-08-17 DIAGNOSIS — M79672 Pain in left foot: Secondary | ICD-10-CM

## 2021-08-17 DIAGNOSIS — R739 Hyperglycemia, unspecified: Secondary | ICD-10-CM

## 2021-08-17 DIAGNOSIS — E782 Mixed hyperlipidemia: Secondary | ICD-10-CM

## 2021-08-17 DIAGNOSIS — L659 Nonscarring hair loss, unspecified: Secondary | ICD-10-CM | POA: Diagnosis not present

## 2021-08-17 DIAGNOSIS — L811 Chloasma: Secondary | ICD-10-CM

## 2021-08-17 MED ORDER — FAMOTIDINE 20 MG PO TABS: 20.0000 mg | ORAL_TABLET | Freq: Two times a day (BID) | ORAL | 3 refills | Status: DC

## 2021-09-07 ENCOUNTER — Encounter: Payer: Self-pay | Admitting: Podiatry

## 2021-09-07 ENCOUNTER — Ambulatory Visit: Payer: 59

## 2021-09-07 ENCOUNTER — Ambulatory Visit: Payer: 59 | Admitting: Podiatry

## 2021-09-07 DIAGNOSIS — M7752 Other enthesopathy of left foot: Secondary | ICD-10-CM | POA: Diagnosis not present

## 2021-09-07 DIAGNOSIS — G5792 Unspecified mononeuropathy of left lower limb: Secondary | ICD-10-CM

## 2021-09-07 DIAGNOSIS — M7750 Other enthesopathy of unspecified foot: Secondary | ICD-10-CM

## 2021-09-08 NOTE — Progress Notes (Signed)
?  Subjective:  ?Patient ID: Elray Mcgregor, female    DOB: [birth year],  MRN: [id number] ? ?Chief Complaint  ?Patient presents with  ? Foot Pain  ?  Left foot pain  ? ? ?61 y.o. female presents with the above complaint. History confirmed with patient.  Its been painful for a few months now.  Severity bothersome on the top of the foot its worse with shoe gear that is compressing it.  Feels like it starting to get better slowly ? ?Objective:  ?Physical Exam: ?warm, good capillary refill, no trophic changes or ulcerative lesions, normal DP and PT pulses, normal sensory exam, and palpable dorsal spur in the mid arch over the medial cuneiform.  Unable to reproduce a palpable Tinel's sign or sharp pain. ? ?Radiographs: ?Multiple views x-ray of the left foot: Slight dorsal spurring of the medial cuneiform, minimal degenerative changes noted and joint she has a pes cavus foot type. ?Assessment:  ? ?1. Bone spur of left foot   ?2. Peripheral neuritis of left foot   ? ? ? ?Plan:  ?Patient was evaluated and treated and all questions answered. ? ?We reviewed her radiographs together and discussed how a high arch pes cavus foot type contributes to increased pressure on the top of the foot she does have a palpable spur here.  We discussed shoe gear and altering lacing patterns to avoid compression on this.  I expect this to resolve uneventfully.  Discussed with her injection of corticosteroid could alleviate further pain and inflammation if it continues to bother her or worsen.  She will return as needed. ? ?Return if symptoms worsen or fail to improve.  ? ?

## 2021-09-18 ENCOUNTER — Other Ambulatory Visit: Payer: Self-pay | Admitting: Family Medicine

## 2021-09-18 DIAGNOSIS — K219 Gastro-esophageal reflux disease without esophagitis: Secondary | ICD-10-CM

## 2021-12-17 ENCOUNTER — Other Ambulatory Visit: Payer: Self-pay | Admitting: Family Medicine

## 2022-01-28 NOTE — Progress Notes (Signed)
Name: Jaime Rice   MRN: [id number]    DOB: 09-18-1960   Date:01/29/2022       Progress Note  Subjective  Chief Complaint  Annual Exam  HPI  Patient presents for annual CPE.  Diet: balanced  Exercise: continue current regular activity   Last Eye Exam: due for follow up Last Dental Exam: every 6 months   [place] Office Visit from 01/29/2022 in [facility]  AUDIT-C Score 3      Depression: Phq 9 is  negative    01/29/2022   10:35 AM 08/17/2021   11:17 AM 01/26/2021    9:38 AM 06/26/2020    8:27 AM 04/07/2020    9:32 AM  Depression screen PHQ 2/9  Decreased Interest 0 0 0 0 0  Down, Depressed, Hopeless 0 0 0 0 0  PHQ - 2 Score 0 0 0 0 0  Altered sleeping 3 0     Tired, decreased energy 0 0     Change in appetite 0 0     Feeling bad or failure about yourself  0 0     Trouble concentrating 0 0     Moving slowly or fidgety/restless 0 0     Suicidal thoughts 0 0     PHQ-9 Score 3 0     Difficult doing work/chores    Not difficult at all    Hypertension: BP Readings from Last 3 Encounters:  01/29/22 118/66  08/17/21 126/74  01/26/21 126/64   Obesity: Wt Readings from Last 3 Encounters:  01/29/22 133 lb (60.3 kg)  08/17/21 133 lb (60.3 kg)  01/26/21 133 lb (60.3 kg)   BMI Readings from Last 3 Encounters:  01/29/22 22.83 kg/m  08/17/21 22.83 kg/m  01/26/21 22.83 kg/m     Vaccines:   HPV: N/A Tdap: up to date Shingrix: up to date Pneumonia: N/A Flu: today  COVID-19: discussed booster  RSV: discussed it today    Hep C Screening: 12/21/18 STD testing and prevention (HIV/chl/gon/syphilis): 06/26/20 Intimate partner violence: negative screen  Sexual History : one partner, no pain during intercourse and no vaginal discharge  Menstrual History/LMP/Abnormal Bleeding: s/p menopause  Discussed importance of follow up if any post-menopausal bleeding: yes  Incontinence Symptoms: negative for symptoms   Breast cancer:  - Last  Mammogram: 07/01/21 - BRCA gene screening: N/A  Osteoporosis Prevention : Discussed high calcium and vitamin D supplementation, weight bearing exercises Bone density: N/A  Cervical cancer screening: 06/26/20  Skin cancer: Discussed monitoring for atypical lesions  Colorectal cancer: 01/29/20  - done in [place]  Lung cancer:  Low Dose CT Chest recommended if Age 29-80 years, 20 pack-year currently smoking OR have quit w/in 15years. Patient does not qualify for screen   ECG: N/A  Advanced Care Planning: A voluntary discussion about advance care planning including the explanation and discussion of advance directives.  Discussed health care proxy and Living will, and the patient was able to identify a health care proxy as husband .  Patient does not have a living will and power of attorney of health care   Lipids: Lab Results  Component Value Date   CHOL 182 01/26/2021   CHOL 184 04/07/2020   CHOL 259 (H) 10/01/2019   Lab Results  Component Value Date   HDL 90 01/26/2021   HDL 87 04/07/2020   HDL 87 10/01/2019   Lab Results  Component Value Date   LDLCALC 79 01/26/2021   [place] 81 04/07/2020   [place]  153 (H) 10/01/2019   Lab Results  Component Value Date   TRIG 52 01/26/2021   TRIG 75 04/07/2020   TRIG 82 10/01/2019   Lab Results  Component Value Date   CHOLHDL 2.0 01/26/2021   CHOLHDL 2.1 04/07/2020   CHOLHDL 3.0 10/01/2019   No results found for: "LDLDIRECT"  Glucose: Glucose, Bld  Date Value Ref Range Status  01/26/2021 96 65 - 99 mg/dL Final    Comment:    .            Fasting reference interval .   06/26/2020 106 (H) 65 - 99 mg/dL Final    Comment:    .            Fasting reference interval . For someone without known diabetes, a glucose value between 100 and 125 mg/dL is consistent with prediabetes and should be confirmed with a follow-up test. .   04/07/2020 100 (H) 65 - 99 mg/dL Final    Comment:    .            Fasting reference  interval . For someone without known diabetes, a glucose value between 100 and 125 mg/dL is consistent with prediabetes and should be confirmed with a follow-up test. .     Patient Active Problem List   Diagnosis Date Noted   Eczema 02/05/2020   Herniated disc, cervical 01/30/2019   Gastroesophageal reflux disease 07/14/2018   History of colon polyps 07/14/2018   Cervicalgia 07/14/2018   Other insomnia 07/14/2018   Elevated LFTs 07/14/2018   Hyperlipidemia 07/14/2018    No past surgical history on file.  Family History  Problem Relation Age of Onset   Uterine cancer Mother 30   Prostate cancer Father 16    Social History   Socioeconomic History   Marital status: Married    Spouse name: Duanne Moron   Number of children: 1   Years of education: Not on file   Highest education level: Not on file  Occupational History   Not on file  Tobacco Use   Smoking status: Never   Smokeless tobacco: Never  Vaping Use   Vaping Use: Never used  Substance and Sexual Activity   Alcohol use: Yes    Comment: occasional   Drug use: Never   Sexual activity: Yes    Partners: Male    Birth control/protection: Post-menopausal  Other Topics Concern   Not on file  Social History Narrative   Moved from [place] (Originally from [place] - moved to [place] after college), moved to [place] in October 2019 to live with her husband.  Has 1 daughter who lives in [place]. She has 1 dog.  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  (01/29/2022)   Overall Financial Resource Strain (CARDIA)    Difficulty of Paying Living Expenses: Not hard at all  Food Insecurity: No Food Insecurity (01/29/2022)   Hunger Vital Sign    Worried About Running Out of Food in the Last Year: Never true    Ran Out of Food in the Last Year: Never true  Transportation Needs: No Transportation Needs (01/29/2022)   PRAPARE - Radiographer, therapeutic (Medical): No    Lack of Transportation (Non-Medical): No  Physical Activity: Sufficiently Active (01/29/2022)   Exercise Vital Sign    Days of Exercise per Week: 5 days    Minutes of Exercise per Session:  30 min  Stress: Stress Concern Present (01/29/2022)   [place]    Feeling of Stress : Rather much  Social Connections: Socially Integrated (01/29/2022)   Social Connection and Isolation Panel [NHANES]    Frequency of Communication with Friends and Family: More than three times a week    Frequency of Social Gatherings with Friends and Family: Once a week    Attends Religious Services: More than 4 times per year    Active Member of [employer] or Organizations: Yes    Attends Archivist Meetings: 1 to 4 times per year    Marital Status: Married  Human resources officer Violence: Not At Risk (01/29/2022)   Humiliation, Afraid, Rape, and Kick questionnaire    Fear of Current or Ex-Partner: No    Emotionally Abused: No    Physically Abused: No    Sexually Abused: No     Current Outpatient Medications:    famotidine (PEPCID) 20 MG tablet, Take 1 tablet (20 mg total) by mouth daily., Disp: 90 tablet, Rfl: 3   omeprazole (PRILOSEC) 20 MG capsule, TAKE 1 CAPSULE (20 MG TOTAL) BY MOUTH DAILY AS NEEDED (RECOMMEND USING FOR 14 D TREATMENT COURSES AT A TIME WHEN NEEDED)., Disp: 90 capsule, Rfl: 0   rosuvastatin (CRESTOR) 10 MG tablet, TAKE 1 TABLET BY MOUTH EVERY DAY, Disp: 90 tablet, Rfl: 0  No Known Allergies   ROS  Constitutional: Negative for fever or weight change.  Respiratory: Negative for cough and shortness of breath.   Cardiovascular: Negative for chest pain or palpitations.  Gastrointestinal: positive  for abdominal pain that lasted a couple days twice over the past 4 weeks but resolved now, abdomen felt sore- no change in bowel movements , vaginal discharge or pain during sex. Advised to return if  symptoms returns.  Musculoskeletal: Negative for gait problem or joint swelling.  Skin: Negative for rash.  Neurological: Negative for dizziness or headache.  No other specific complaints in a complete review of systems (except as listed in HPI above).   Objective  Vitals:   01/29/22 1036  BP: 118/66  Pulse: 85  Resp: 16  SpO2: 98%  Weight: 133 lb (60.3 kg)  Height: $Remove'5\' 4"'FbEGIiH$  (1.626 m)    Body mass index is 22.83 kg/m.  Physical Exam   Constitutional: Patient appears well-developed and well-nourished. No distress.  HENT: Head: Normocephalic and atraumatic. Ears: B TMs ok, no erythema or effusion; Nose: Nose normal. Mouth/Throat: Oropharynx is clear and moist. No oropharyngeal exudate.  Eyes: Conjunctivae and EOM are normal. Pupils are equal, round, and reactive to light. No scleral icterus.  Neck: Normal range of motion. Neck supple. No JVD present. No thyromegaly present.  Cardiovascular: Normal rate, regular rhythm and normal heart sounds.  No murmur heard. No BLE edema. Pulmonary/Chest: Effort normal and breath sounds normal. No respiratory distress. Abdominal: Soft. Bowel sounds are normal, no distension. There is no tenderness. no masses Breast: no lumps or masses, no nipple discharge or rashes FEMALE GENITALIA:  Not done  RECTAL: not done  Musculoskeletal: Normal range of motion, no joint effusions. No gross deformities Neurological: he is alert and oriented to person, place, and time. No cranial nerve deficit. Coordination, balance, strength, speech and gait are normal.  Skin: Skin is warm and dry. No rash noted. No erythema.  Psychiatric: Patient has a normal mood and affect. behavior is normal. Judgment and thought content normal.    Fall Risk:    01/29/2022  10:35 AM 08/17/2021   11:17 AM 01/26/2021    9:38 AM 06/26/2020    8:26 AM 04/07/2020    9:31 AM  [place] in the past year? 0 0 0 0 0  Number falls in past yr: 0 0 0 0 0  Injury with Fall? 0 0 0 0 0   Risk for fall due to : No Fall Risks No Fall Risks No Fall Risks    Follow up Falls prevention discussed Falls prevention discussed Falls prevention discussed  Falls evaluation completed     Functional Status Survey: Is the patient deaf or have difficulty hearing?: No Does the patient have difficulty seeing, even when wearing glasses/contacts?: No Does the patient have difficulty concentrating, remembering, or making decisions?: No Does the patient have difficulty walking or climbing stairs?: No Does the patient have difficulty dressing or bathing?: No Does the patient have difficulty doing errands alone such as visiting a doctor's office or shopping?: No   Assessment & Plan  1. Well adult exam   2. Need for immunization against influenza\  - Flu Vaccine QUAD 6+ mos PF IM (Fluarix Quad PF)  3. Gastroesophageal reflux disease without esophagitis  - Ambulatory referral to Gastroenterology - CBC with Differential/Platelet  4. Colon cancer screening  - Ambulatory referral to Gastroenterology  5. History of colon polyps  - Ambulatory referral to Gastroenterology - CBC with Differential/Platelet  6. Breast cancer screening by mammogram  - MM 3D SCREEN BREAST BILATERAL; Future  7. Hyperglycemia  - Hemoglobin A1c  8. Dyslipidemia  - Lipid panel  Last level normal , changed her diet   9. Elevated LFTs  -COMPLETE METABOLIC PANEL WITH GFR    -USPSTF grade A and B recommendations reviewed with patient; age-appropriate recommendations, preventive care, screening tests, etc discussed and encouraged; healthy living encouraged; see AVS for patient education given to patient -Discussed importance of 150 minutes of physical activity weekly, eat two servings of fish weekly, eat one serving of tree nuts ( cashews, pistachios, pecans, almonds.Marland Kitchen) every other day, eat 6 servings of fruit/vegetables daily and drink plenty of water and avoid sweet beverages.   -Reviewed Health  Maintenance: Yes.

## 2022-01-28 NOTE — Patient Instructions (Signed)
Preventive Care 40-61 Years Old, Female Preventive care refers to lifestyle choices and visits with your health care provider that can promote health and wellness. Preventive care visits are also called wellness exams. What can I expect for my preventive care visit? Counseling Your health care provider may ask you questions about your: Medical history, including: Past medical problems. Family medical history. Pregnancy history. Current health, including: Menstrual cycle. Method of birth control. Emotional well-being. Home life and relationship well-being. Sexual activity and sexual health. Lifestyle, including: Alcohol, nicotine or tobacco, and drug use. Access to firearms. Diet, exercise, and sleep habits. Work and work environment. Sunscreen use. Safety issues such as seatbelt and bike helmet use. Physical exam Your health care provider will check your: Height and weight. These may be used to calculate your BMI (body mass index). BMI is a measurement that tells if you are at a healthy weight. Waist circumference. This measures the distance around your waistline. This measurement also tells if you are at a healthy weight and may help predict your risk of certain diseases, such as type 2 diabetes and high blood pressure. Heart rate and blood pressure. Body temperature. Skin for abnormal spots. What immunizations do I need?  Vaccines are usually given at various ages, according to a schedule. Your health care provider will recommend vaccines for you based on your age, medical history, and lifestyle or other factors, such as travel or where you work. What tests do I need? Screening Your health care provider may recommend screening tests for certain conditions. This may include: Lipid and cholesterol levels. Diabetes screening. This is done by checking your blood sugar (glucose) after you have not eaten for a while (fasting). Pelvic exam and Pap test. Hepatitis B test. Hepatitis C  test. HIV (human immunodeficiency virus) test. STI (sexually transmitted infection) testing, if you are at risk. Lung cancer screening. Colorectal cancer screening. Mammogram. Talk with your health care provider about when you should start having regular mammograms. This may depend on whether you have a family history of breast cancer. BRCA-related cancer screening. This may be done if you have a family history of breast, ovarian, tubal, or peritoneal cancers. Bone density scan. This is done to screen for osteoporosis. Talk with your health care provider about your test results, treatment options, and if necessary, the need for more tests. Follow these instructions at home: Eating and drinking  Eat a diet that includes fresh fruits and vegetables, whole grains, lean protein, and low-fat dairy products. Take vitamin and mineral supplements as recommended by your health care provider. Do not drink alcohol if: Your health care provider tells you not to drink. You are pregnant, may be pregnant, or are planning to become pregnant. If you drink alcohol: Limit how much you have to 0-1 drink a day. Know how much alcohol is in your drink. In the [place] one drink equals one 12 oz bottle of beer (355 mL), one 5 oz glass of wine (148 mL), or one 1 oz glass of hard liquor (44 mL). Lifestyle Brush your teeth every morning and night with fluoride toothpaste. Floss one time each day. Exercise for at least 30 minutes 5 or more days each week. Do not use any products that contain nicotine or tobacco. These products include cigarettes, chewing tobacco, and vaping devices, such as e-cigarettes. If you need help quitting, ask your health care provider. Do not use drugs. If you are sexually active, practice safe sex. Use a condom or other form of protection to   prevent STIs. If you do not wish to become pregnant, use a form of birth control. If you plan to become pregnant, see your health care provider for a  prepregnancy visit. Take aspirin only as told by your health care provider. Make sure that you understand how much to take and what form to take. Work with your health care provider to find out whether it is safe and beneficial for you to take aspirin daily. Find healthy ways to manage stress, such as: Meditation, yoga, or listening to music. Journaling. Talking to a trusted person. Spending time with friends and family. Minimize exposure to UV radiation to reduce your risk of skin cancer. Safety Always wear your seat belt while driving or riding in a vehicle. Do not drive: If you have been drinking alcohol. Do not ride with someone who has been drinking. When you are tired or distracted. While texting. If you have been using any mind-altering substances or drugs. Wear a helmet and other protective equipment during sports activities. If you have firearms in your house, make sure you follow all gun safety procedures. Seek help if you have been physically or sexually abused. What's next? Visit your health care provider once a year for an annual wellness visit. Ask your health care provider how often you should have your eyes and teeth checked. Stay up to date on all vaccines. This information is not intended to replace advice given to you by your health care provider. Make sure you discuss any questions you have with your health care provider. Document Revised: 10/08/2020 Document Reviewed: 10/08/2020 [employer] Patient Education  [place].

## 2022-01-29 ENCOUNTER — Encounter: Payer: Self-pay | Admitting: Family Medicine

## 2022-01-29 ENCOUNTER — Ambulatory Visit (INDEPENDENT_AMBULATORY_CARE_PROVIDER_SITE_OTHER): Payer: 59 | Admitting: Family Medicine

## 2022-01-29 VITALS — BP 118/66 | HR 85 | Resp 16 | Ht 64.0 in | Wt 133.0 lb

## 2022-01-29 DIAGNOSIS — R7989 Other specified abnormal findings of blood chemistry: Secondary | ICD-10-CM

## 2022-01-29 DIAGNOSIS — Z1231 Encounter for screening mammogram for malignant neoplasm of breast: Secondary | ICD-10-CM

## 2022-01-29 DIAGNOSIS — E785 Hyperlipidemia, unspecified: Secondary | ICD-10-CM

## 2022-01-29 DIAGNOSIS — Z86010 Personal history of colonic polyps: Secondary | ICD-10-CM

## 2022-01-29 DIAGNOSIS — R739 Hyperglycemia, unspecified: Secondary | ICD-10-CM

## 2022-01-29 DIAGNOSIS — Z23 Encounter for immunization: Secondary | ICD-10-CM | POA: Diagnosis not present

## 2022-01-29 DIAGNOSIS — Z0000 Encounter for general adult medical examination without abnormal findings: Secondary | ICD-10-CM

## 2022-01-29 DIAGNOSIS — Z1211 Encounter for screening for malignant neoplasm of colon: Secondary | ICD-10-CM | POA: Diagnosis not present

## 2022-01-29 DIAGNOSIS — K219 Gastro-esophageal reflux disease without esophagitis: Secondary | ICD-10-CM

## 2022-01-30 LAB — COMPLETE METABOLIC PANEL WITH GFR
AG Ratio: 1.8 (calc) (ref 1.0–2.5)
ALT: 25 U/L (ref 6–29)
AST: 46 U/L — ABNORMAL HIGH (ref 10–35)
Albumin: 4.6 g/dL (ref 3.6–5.1)
Alkaline phosphatase (APISO): 71 U/L (ref 37–153)
BUN: 16 mg/dL (ref 7–25)
CO2: 29 mmol/L (ref 20–32)
Calcium: 9.9 mg/dL (ref 8.6–10.4)
Chloride: 103 mmol/L (ref 98–110)
Creat: 0.89 mg/dL (ref 0.50–1.05)
Globulin: 2.6 g/dL (calc) (ref 1.9–3.7)
Glucose, Bld: 97 mg/dL (ref 65–99)
Potassium: 4.3 mmol/L (ref 3.5–5.3)
Sodium: 140 mmol/L (ref 135–146)
Total Bilirubin: 0.4 mg/dL (ref 0.2–1.2)
Total Protein: 7.2 g/dL (ref 6.1–8.1)
eGFR: 74 mL/min/{1.73_m2} (ref >=60)

## 2022-01-30 LAB — HEMOGLOBIN A1C
Hgb A1c MFr Bld: 5.9 % of total Hgb — ABNORMAL HIGH (ref <5.7)
Mean Plasma Glucose: 123 mg/dL
eAG (mmol/L): 6.8 mmol/L

## 2022-01-30 LAB — CBC WITH DIFFERENTIAL/PLATELET
Absolute Monocytes: 343 cells/uL (ref 200–950)
Basophils Absolute: 10 cells/uL (ref 0–200)
Basophils Relative: 0.2 %
Eosinophils Absolute: 59 cells/uL (ref 15–500)
Eosinophils Relative: 1.2 %
HCT: 40.2 % (ref 35.0–45.0)
Hemoglobin: 13.3 g/dL (ref 11.7–15.5)
Lymphs Abs: 2038 cells/uL (ref 850–3900)
MCH: 31.6 pg (ref 27.0–33.0)
MCHC: 33.1 g/dL (ref 32.0–36.0)
MCV: 95.5 fL (ref 80.0–100.0)
MPV: 10.7 fL (ref 7.5–12.5)
Monocytes Relative: 7 %
Neutro Abs: 2450 cells/uL (ref 1500–7800)
Neutrophils Relative %: 50 %
Platelets: 275 10*3/uL (ref 140–400)
RBC: 4.21 10*6/uL (ref 3.80–5.10)
RDW: 12.1 % (ref 11.0–15.0)
Total Lymphocyte: 41.6 %
WBC: 4.9 10*3/uL (ref 3.8–10.8)

## 2022-01-30 LAB — LIPID PANEL
Cholesterol: 175 mg/dL (ref <200)
HDL: 87 mg/dL (ref >=50)
LDL Cholesterol (Calc): 75 mg/dL (calc)
Non-HDL Cholesterol (Calc): 88 mg/dL (calc) (ref <130)
Total CHOL/HDL Ratio: 2 (calc) (ref <5.0)
Triglycerides: 51 mg/dL (ref <150)

## 2022-03-18 ENCOUNTER — Other Ambulatory Visit: Payer: Self-pay | Admitting: Family Medicine

## 2022-06-11 ENCOUNTER — Other Ambulatory Visit: Payer: Self-pay

## 2022-06-11 ENCOUNTER — Encounter: Payer: Self-pay | Admitting: Family Medicine

## 2022-06-11 DIAGNOSIS — K219 Gastro-esophageal reflux disease without esophagitis: Secondary | ICD-10-CM

## 2022-06-14 MED ORDER — ROSUVASTATIN CALCIUM 10 MG PO TABS: 10.0000 mg | ORAL_TABLET | Freq: Every day | ORAL | 0 refills | Status: AC

## 2022-06-14 MED ORDER — FAMOTIDINE 20 MG PO TABS: 20.0000 mg | ORAL_TABLET | Freq: Every day | ORAL | 0 refills | Status: AC

## 2022-06-14 MED ORDER — OMEPRAZOLE 20 MG PO CPDR: 20.0000 mg | DELAYED_RELEASE_CAPSULE | Freq: Every day | ORAL | 0 refills | Status: AC | PRN

## 2022-06-18 NOTE — Telephone Encounter (Signed)
Copied from [place] [phone] [phone]. Topic: Referral - Request for Referral >> Jun 17, 2022  4:06 PM Everette C wrote: Has patient seen PCP for this complaint? Yes.   *If NO, is insurance requiring patient see PCP for this issue before PCP can refer them? Referral for which specialty: Gastroenterology  Preferred provider/office: [facility]  Reason for referral: Stomach discomfort >> Jun 17, 2022  4:13 PM Everette C wrote: Please fax referral to [phone] or [phone]

## 2022-06-27 ENCOUNTER — Other Ambulatory Visit: Payer: Self-pay | Admitting: Family Medicine

## 2022-08-18 ENCOUNTER — Ambulatory Visit: Payer: 59 | Admitting: Family Medicine

## 2022-10-04 ENCOUNTER — Other Ambulatory Visit: Payer: Self-pay | Admitting: Family Medicine

## 2023-02-02 ENCOUNTER — Encounter: Payer: 59 | Admitting: Family Medicine

## 2023-05-13 ENCOUNTER — Other Ambulatory Visit: Payer: Self-pay | Admitting: Family Medicine

## 2023-05-13 DIAGNOSIS — K219 Gastro-esophageal reflux disease without esophagitis: Secondary | ICD-10-CM
